# Patient Record
Sex: Male | Born: 1945 | Race: White | Hispanic: No | Marital: Married | Smoking: Former smoker
Health system: Southern US, Community
[De-identification: ages and names within clinical notes are randomized; demographics above are authoritative.]

## PROBLEM LIST (undated history)

## (undated) DIAGNOSIS — H348192 Central retinal vein occlusion, unspecified eye, stable: Secondary | ICD-10-CM

## (undated) DIAGNOSIS — Z9989 Dependence on other enabling machines and devices: Secondary | ICD-10-CM

## (undated) DIAGNOSIS — I10 Essential (primary) hypertension: Secondary | ICD-10-CM

## (undated) DIAGNOSIS — F419 Anxiety disorder, unspecified: Secondary | ICD-10-CM

## (undated) DIAGNOSIS — N402 Nodular prostate without lower urinary tract symptoms: Secondary | ICD-10-CM

## (undated) DIAGNOSIS — R972 Elevated prostate specific antigen [PSA]: Secondary | ICD-10-CM

## (undated) DIAGNOSIS — E119 Type 2 diabetes mellitus without complications: Secondary | ICD-10-CM

## (undated) DIAGNOSIS — G4733 Obstructive sleep apnea (adult) (pediatric): Secondary | ICD-10-CM

## (undated) DIAGNOSIS — L309 Dermatitis, unspecified: Secondary | ICD-10-CM

## (undated) DIAGNOSIS — C4491 Basal cell carcinoma of skin, unspecified: Secondary | ICD-10-CM

## (undated) DIAGNOSIS — E785 Hyperlipidemia, unspecified: Secondary | ICD-10-CM

## (undated) DIAGNOSIS — G4700 Insomnia, unspecified: Secondary | ICD-10-CM

## (undated) DIAGNOSIS — L299 Pruritus, unspecified: Secondary | ICD-10-CM

## (undated) HISTORY — DX: Nodular prostate without lower urinary tract symptoms: N40.2

## (undated) HISTORY — DX: Hyperlipidemia, unspecified: E78.5

## (undated) HISTORY — DX: Type 2 diabetes mellitus without complications: E11.9

## (undated) HISTORY — DX: Obstructive sleep apnea (adult) (pediatric): G47.33

## (undated) HISTORY — DX: Central retinal vein occlusion, unspecified eye, stable: H34.8192

## (undated) HISTORY — DX: Anxiety disorder, unspecified: F41.9

## (undated) HISTORY — PX: NO PAST SURGERIES: SHX2092

## (undated) HISTORY — DX: Elevated prostate specific antigen (PSA): R97.20

## (undated) HISTORY — DX: Essential (primary) hypertension: I10

## (undated) HISTORY — DX: Dependence on other enabling machines and devices: Z99.89

## (undated) HISTORY — PX: TOOTH EXTRACTION: SUR596

## (undated) HISTORY — DX: Basal cell carcinoma of skin, unspecified: C44.91

## (undated) HISTORY — DX: Insomnia, unspecified: G47.00

---

## 2007-09-25 ENCOUNTER — Ambulatory Visit: Payer: Self-pay | Admitting: Gastroenterology

## 2007-09-25 LAB — HM COLONOSCOPY

## 2011-05-01 ENCOUNTER — Ambulatory Visit: Payer: Self-pay | Admitting: Unknown Physician Specialty

## 2011-05-02 LAB — PATHOLOGY REPORT

## 2013-09-24 LAB — LIPID PANEL
CHOLESTEROL: 115 mg/dL (ref 0–200)
HDL: 29 mg/dL — AB (ref 35–70)
LDL Cholesterol: 65 mg/dL

## 2013-09-24 LAB — HEPATIC FUNCTION PANEL: Bilirubin, Total: 0.6 mg/dL

## 2013-09-24 LAB — CBC AND DIFFERENTIAL
HCT: 42 % (ref 41–53)
Hemoglobin: 14.2 g/dL (ref 13.5–17.5)
PLATELETS: 261 10*3/uL (ref 150–399)

## 2013-09-24 LAB — BASIC METABOLIC PANEL
CREATININE: 1.1 mg/dL (ref 0.6–1.3)
GLUCOSE: 238 mg/dL

## 2014-11-22 ENCOUNTER — Encounter: Payer: Self-pay | Admitting: *Deleted

## 2014-12-01 ENCOUNTER — Encounter: Payer: Self-pay | Admitting: *Deleted

## 2014-12-01 DIAGNOSIS — F411 Generalized anxiety disorder: Secondary | ICD-10-CM | POA: Insufficient documentation

## 2014-12-01 DIAGNOSIS — G4700 Insomnia, unspecified: Secondary | ICD-10-CM | POA: Insufficient documentation

## 2014-12-01 DIAGNOSIS — E785 Hyperlipidemia, unspecified: Secondary | ICD-10-CM | POA: Insufficient documentation

## 2014-12-01 DIAGNOSIS — G4733 Obstructive sleep apnea (adult) (pediatric): Secondary | ICD-10-CM | POA: Insufficient documentation

## 2014-12-01 DIAGNOSIS — Z23 Encounter for immunization: Secondary | ICD-10-CM | POA: Insufficient documentation

## 2014-12-01 DIAGNOSIS — N4289 Other specified disorders of prostate: Secondary | ICD-10-CM | POA: Insufficient documentation

## 2014-12-01 DIAGNOSIS — Z1211 Encounter for screening for malignant neoplasm of colon: Secondary | ICD-10-CM | POA: Insufficient documentation

## 2014-12-01 DIAGNOSIS — E119 Type 2 diabetes mellitus without complications: Secondary | ICD-10-CM | POA: Insufficient documentation

## 2014-12-01 DIAGNOSIS — L309 Dermatitis, unspecified: Secondary | ICD-10-CM | POA: Insufficient documentation

## 2014-12-01 DIAGNOSIS — I10 Essential (primary) hypertension: Secondary | ICD-10-CM | POA: Insufficient documentation

## 2014-12-01 DIAGNOSIS — L299 Pruritus, unspecified: Secondary | ICD-10-CM | POA: Insufficient documentation

## 2014-12-01 DIAGNOSIS — H348192 Central retinal vein occlusion, unspecified eye, stable: Secondary | ICD-10-CM | POA: Insufficient documentation

## 2014-12-01 DIAGNOSIS — Z79899 Other long term (current) drug therapy: Secondary | ICD-10-CM | POA: Insufficient documentation

## 2014-12-01 DIAGNOSIS — Z125 Encounter for screening for malignant neoplasm of prostate: Secondary | ICD-10-CM | POA: Insufficient documentation

## 2014-12-01 DIAGNOSIS — C4491 Basal cell carcinoma of skin, unspecified: Secondary | ICD-10-CM | POA: Insufficient documentation

## 2014-12-01 DIAGNOSIS — B86 Scabies: Secondary | ICD-10-CM | POA: Insufficient documentation

## 2014-12-08 ENCOUNTER — Telehealth: Payer: Self-pay | Admitting: Urology

## 2014-12-08 NOTE — Telephone Encounter (Signed)
The pt called with concerns after his biopsy on Friday, May 27th. He said he notice blood in his stool after the biopsy, but it subsided after 2 days. The pt is concern because the blood returned on yesterday. Its a bright red blood in his loose stool. He describes it as a gassy diarrhea with bright red blood. I spoke w/ Dr. Elnoria Howard and he recommended that the pt call his PCP to rule out, no infection. The pt called Dr. Rosanna Randy office and he informed the pt to go to the ER.  The pt called back and said that it's not a large amount and he thinks its possible hemorrhoids, because he's had them before. Dr. Elnoria Howard said the pt could just monitor his stool the blood and if it increase to go to the ER. If not just keep his appt to come in on next week.

## 2014-12-09 ENCOUNTER — Ambulatory Visit (INDEPENDENT_AMBULATORY_CARE_PROVIDER_SITE_OTHER): Payer: Commercial Managed Care - HMO | Admitting: Urology

## 2014-12-09 DIAGNOSIS — C61 Malignant neoplasm of prostate: Secondary | ICD-10-CM

## 2014-12-09 NOTE — Progress Notes (Signed)
Patient ID: Juan Mack, male   DOB: Feb 11, 1946, 69 y.o.   MRN: [id number]

## 2015-01-11 ENCOUNTER — Encounter: Payer: Self-pay | Admitting: Urology

## 2015-01-11 ENCOUNTER — Other Ambulatory Visit: Payer: Self-pay

## 2015-01-11 ENCOUNTER — Ambulatory Visit (INDEPENDENT_AMBULATORY_CARE_PROVIDER_SITE_OTHER): Payer: Commercial Managed Care - HMO | Admitting: Urology

## 2015-01-11 VITALS — BP 182/82 | HR 70 | Resp 18 | Ht 60.0 in | Wt 198.9 lb

## 2015-01-11 DIAGNOSIS — Z8546 Personal history of malignant neoplasm of prostate: Secondary | ICD-10-CM

## 2015-01-11 DIAGNOSIS — R972 Elevated prostate specific antigen [PSA]: Secondary | ICD-10-CM

## 2015-01-11 NOTE — Progress Notes (Addendum)
01/11/2015 10:09 AM   Juan Mack 1945/10/28 [id number]  Referring provider: No referring provider defined for this encounter.  Chief Complaint  Patient presents with  . Advice Only  . Follow-up    HPI: Dr. 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 Patient was given all options for treatment and elected to do watchful waiting for 6 months. He wants a PSA in 6 months. At age 69 I think this is a good option sintering the low grade of his prostate and the fact that none of the other biopsies showed any evidence cancer or high-grade PIM     PMH: Past Medical History  Diagnosis Date  . Hypertension   . Diabetes mellitus, type II   . Hyperlipidemia   . OSA on CPAP   . BCC (basal cell carcinoma)   . Central vein occlusion of retina   . Insomnia   . Anxiety disorder   . Prostate nodule   . Elevated PSA     Surgical History: No past surgical history on file.  Home Medications:    Medication List       This list is accurate as of: 01/11/15 10:09 AM.  Always use your most recent med list.               amLODipine 5 MG tablet  Commonly known as:  NORVASC  Take 5 mg by mouth daily.     aspirin 81 MG EC tablet  Take 81 mg by mouth daily. Swallow whole.     atorvastatin 20 MG tablet  Commonly known as:  LIPITOR  Take 20 mg by mouth daily.     benazepril 40 MG tablet  Commonly known as:  LOTENSIN  Take 40 mg by mouth daily.     latanoprost 0.005 % ophthalmic solution  Commonly known as:  XALATAN  Place 1 drop into both eyes at bedtime.     metFORMIN 1000 MG tablet  Commonly known as:  GLUCOPHAGE  Take 1,000 mg by mouth 2 (two) times daily with a meal.     pioglitazone 30 MG tablet  Commonly known as:  ACTOS  Take 30 mg by mouth daily.        Allergies: No Known Allergies  Family History: Family  History  Problem Relation Age of Onset  . Prostate cancer Brother     Father  . Colon cancer Brother   . Congestive Heart Failure Sister   . Lung cancer Sister   . Coronary artery disease Father   . Diabetes Mellitus II Brother     Social History:  reports that he quit smoking about 41 years ago. His smoking use included Cigarettes. He quit after 14 years of use. He does not have any smokeless tobacco history on file. He reports that he does not drink alcohol. His drug history is not on file.  ROS: Urological Symptom Review  Patient is experiencing the following symptoms: Erection problems (male only)   Review of Systems  Gastrointestinal (upper)  : Negative for upper GI symptoms  Gastrointestinal (lower) : Negative for lower GI symptoms  Constitutional : Negative for symptoms  Skin: Negative for skin symptoms  Eyes: Negative for eye symptoms  Ear/Nose/Throat : Negative for Ear/Nose/Throat symptoms  Hematologic/Lymphatic: Negative for Hematologic/Lymphatic symptoms  Cardiovascular : Negative for cardiovascular symptoms  Respiratory : Negative for respiratory symptoms  Endocrine: Negative for endocrine symptoms  Musculoskeletal: Negative for musculoskeletal symptoms  Neurological: Negative for neurological symptoms  Psychologic: Negative for psychiatric symptoms   Physical Exam: BP 182/82 mmHg  Pulse 70  Resp 18  Ht 5' (1.524 m)  Wt 198 lb 14.4 oz (90.22 kg)  BMI 38.84 kg/m2  Constitutional:  Alert and oriented, No acute distress. HEENT: [place] AT, moist mucus membranes.  Trachea midline, no masses. Cardiovascular: No clubbing, cyanosis, or edema. Respiratory: Normal respiratory effort, no increased work of breathing. GI: Abdomen is soft, nontender, nondistended, no abdominal masses GU: No CVA tenderness. Prostate approximately 40 g with a small nodule on the right Skin: No rashes, bruises or suspicious lesions. Lymph: No cervical or inguinal  adenopathy. Neurologic: Grossly intact, no focal deficits, moving all 4 extremities. Psychiatric: Normal mood and affect.  Laboratory Data: No results found for: WBC, HGB, HCT, MCV, PLT  No results found for: CREATININE  No results found for: PSA  No results found for: TESTOSTERONE  No results found for: HGBA1C  Urinalysis No results found for: COLORURINE, APPEARANCEUR, LABSPEC, PHURINE, GLUCOSEU, HGBUR, BILIRUBINUR, KETONESUR, PROTEINUR, UROBILINOGEN, NITRITE, LEUKOCYTESUR  Pertinent Imaging: None  Assessment & Plan:  Watchful waiting PSA in 5 months for a low-grade adenocarcinoma prostate  There are no diagnoses linked to this encounter.  Return in about 4 weeks (around 02/08/2015), or if symptoms worsen or fail to improve.  Collier Flowers, [facility] [address], [place], [place] [address] [phone]

## 2015-01-11 NOTE — Addendum Note (Signed)
Addended by: Rick Duff D on: 01/11/2015 10:18 AM   Modules accepted: Level of Service

## 2015-01-13 ENCOUNTER — Encounter: Payer: Self-pay | Admitting: Urology

## 2015-01-20 ENCOUNTER — Telehealth: Payer: Self-pay | Admitting: Family Medicine

## 2015-02-13 ENCOUNTER — Ambulatory Visit (INDEPENDENT_AMBULATORY_CARE_PROVIDER_SITE_OTHER): Payer: Commercial Managed Care - HMO | Admitting: Family Medicine

## 2015-02-13 ENCOUNTER — Encounter: Payer: Self-pay | Admitting: Family Medicine

## 2015-02-13 VITALS — BP 122/54 | HR 78 | Temp 98.3°F | Resp 16 | Ht 70.0 in | Wt 201.0 lb

## 2015-02-13 DIAGNOSIS — E119 Type 2 diabetes mellitus without complications: Secondary | ICD-10-CM | POA: Diagnosis not present

## 2015-02-13 DIAGNOSIS — E785 Hyperlipidemia, unspecified: Secondary | ICD-10-CM | POA: Diagnosis not present

## 2015-02-13 DIAGNOSIS — Z23 Encounter for immunization: Secondary | ICD-10-CM | POA: Diagnosis not present

## 2015-02-13 DIAGNOSIS — S80212A Abrasion, left knee, initial encounter: Secondary | ICD-10-CM | POA: Diagnosis not present

## 2015-02-13 DIAGNOSIS — I10 Essential (primary) hypertension: Secondary | ICD-10-CM

## 2015-02-13 NOTE — Progress Notes (Signed)
Patient ID: Juan Mack, male   DOB: [birth year], 69 y.o.   MRN: [id number]   Juan Mack  MRN: [id number] DOB: Mar 06, 1946  Subjective:  HPI   1. Essential hypertension Patient is a 69 year old who presents for follow up of his hypertension.  His last visit was on 10/18/14 and his blood pressure at that time was 138/62.  He is currenty taking Amlodipine and Benazepril.  He reports good compliance and tolerance of his medications.  He does not check his blood pressure outside the office.  2. Hyperlipidemia Patientt has not had his cholesterol checked in about 18 months and presents today for follow up.  He is currently taking Atorvastatin and reports good compliance and tolerance.  3. Type 2 diabetes mellitus without complication Patient is also here for his diabetes.  His last A1C was done on 04/25/14 and was 8.0.  He reports his glucose readings are running 180-220 in the morning and 90-130 in the evening.  He reports having no hypoglycemic symptoms.  He is checking his feet regularly and reports no sign of skin breakdown.     Patient Active Problem List   Diagnosis Date Noted  . Basal cell carcinoma of skin 12/01/2014  . 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 Special screening for malignant neoplasms, colon 12/01/2014  . Dermatitis, eczematoid 12/01/2014  . Polypharmacy 12/01/2014  . Essential (primary) hypertension 12/01/2014  . Anxiety, generalized 12/01/2014  . Flu vaccine need 12/01/2014  . HLD (hyperlipidemia) 12/01/2014  . Cannot sleep 12/01/2014  . Pruritic disorder 12/01/2014  . Obstructive apnea 12/01/2014  . Special screening for malignant neoplasm of prostate 12/01/2014  . Prostate lump 12/01/2014  . Scabies 12/01/2014  . Diabetes mellitus, type 2 12/01/2014    Past Medical History  Diagnosis Date  . Hypertension   . Diabetes mellitus, type II   . Hyperlipidemia   . OSA on CPAP   . BCC (basal cell carcinoma)   . Central vein occlusion of retina   .  Insomnia   . Anxiety disorder   . Prostate nodule   . Elevated PSA     History   Social History  . Marital Status: Married    Spouse Name: N/A  . Number of Children: N/A  . Years of Education: N/A   Occupational History  . Not on file.   Social History Main Topics  . Smoking status: Former Smoker -- 14 years    Types: Cigarettes    Quit date: 07/08/1973  . Smokeless tobacco: Not on file  . Alcohol Use: No  . Drug Use: Not on file  . Sexual Activity: Not on file   Other Topics Concern  . Not on file   Social History Narrative    Outpatient Prescriptions Prior to Visit  Medication Sig Dispense Refill  . amLODipine (NORVASC) 5 MG tablet Take 5 mg by mouth daily.    Marland Kitchen aspirin 81 MG EC tablet Take 81 mg by mouth daily. Swallow whole.    Marland Kitchen atorvastatin (LIPITOR) 20 MG tablet Take 20 mg by mouth daily.    . benazepril (LOTENSIN) 40 MG tablet Take 40 mg by mouth daily.    Marland Kitchen latanoprost (XALATAN) 0.005 % ophthalmic solution Place 1 drop into both eyes at bedtime.    . metFORMIN (GLUCOPHAGE) 1000 MG tablet Take 1,000 mg by mouth 2 (two) times daily with a meal.    . pioglitazone (ACTOS) 30 MG tablet Take 30 mg by mouth daily.  No facility-administered medications prior to visit.    No Known Allergies  Review of Systems  Constitutional: Negative.   Eyes: Negative.   Respiratory: Negative.   Cardiovascular: Negative.   Gastrointestinal: Negative.   Genitourinary: Negative for dysuria.  Musculoskeletal: Negative.   Neurological: Negative for dizziness and headaches.  Endo/Heme/Allergies: Negative for polydipsia.  Psychiatric/Behavioral: Negative.    Objective:  BP 122/54 mmHg  Pulse 78  Temp(Src) 98.3 F (36.8 C) (Oral)  Resp 16  Ht 5\' 10"  (1.778 m)  Wt 201 lb (91.173 kg)  BMI 28.84 kg/m2  Physical Exam  Constitutional: He is oriented to person, place, and time and well-developed, well-nourished, and in no distress.  HENT:  Head: Normocephalic and  atraumatic.  Right Ear: External ear normal.  Left Ear: External ear normal.  Nose: Nose normal.  Eyes: Conjunctivae are normal.  Neck: Neck supple.  Cardiovascular: Normal rate, regular rhythm and normal heart sounds.   Pulmonary/Chest: Effort normal and breath sounds normal.  Abdominal: Soft. Bowel sounds are normal.  Neurological: He is alert and oriented to person, place, and time. Gait normal.  Skin: Skin is warm and dry.  Psychiatric: Mood, memory, affect and judgment normal.    Assessment and Plan :  Type 2 diabetes Problems are treated aggressively to try to void and organ damage/disease  Hyperlipidemia  Hypertension  Sleep apnea [facility] 02/13/2015 11:04 AM

## 2015-02-15 LAB — COMPREHENSIVE METABOLIC PANEL
ALT: 13 IU/L (ref 0–44)
AST: 14 IU/L (ref 0–40)
Albumin/Globulin Ratio: 1.8 (ref 1.1–2.5)
Albumin: 4.4 g/dL (ref 3.6–4.8)
Alkaline Phosphatase: 55 IU/L (ref 39–117)
BUN / CREAT RATIO: 15 (ref 10–22)
BUN: 18 mg/dL (ref 8–27)
Bilirubin Total: 0.8 mg/dL (ref 0.0–1.2)
CO2: 22 mmol/L (ref 18–29)
Calcium: 10 mg/dL (ref 8.6–10.2)
Chloride: 101 mmol/L (ref 97–108)
Creatinine, Ser: 1.22 mg/dL (ref 0.76–1.27)
GFR calc Af Amer: 69 mL/min/{1.73_m2} (ref >59)
GFR calc non Af Amer: 60 mL/min/{1.73_m2} (ref >59)
Globulin, Total: 2.4 g/dL (ref 1.5–4.5)
Glucose: 218 mg/dL — ABNORMAL HIGH (ref 65–99)
Potassium: 5.4 mmol/L — ABNORMAL HIGH (ref 3.5–5.2)
SODIUM: 139 mmol/L (ref 134–144)
Total Protein: 6.8 g/dL (ref 6.0–8.5)

## 2015-02-15 LAB — CBC WITH DIFFERENTIAL/PLATELET
BASOS ABS: 0 10*3/uL (ref 0.0–0.2)
BASOS: 0 %
EOS (ABSOLUTE): 0.2 10*3/uL (ref 0.0–0.4)
Eos: 2 %
HEMOGLOBIN: 13.7 g/dL (ref 12.6–17.7)
Hematocrit: 41.7 % (ref 37.5–51.0)
Immature Grans (Abs): 0 10*3/uL (ref 0.0–0.1)
Immature Granulocytes: 0 %
LYMPHS: 30 %
Lymphocytes Absolute: 3 10*3/uL (ref 0.7–3.1)
MCH: 32.2 pg (ref 26.6–33.0)
MCHC: 32.9 g/dL (ref 31.5–35.7)
MCV: 98 fL — ABNORMAL HIGH (ref 79–97)
MONOCYTES: 7 %
Monocytes Absolute: 0.7 10*3/uL (ref 0.1–0.9)
NEUTROS ABS: 5.9 10*3/uL (ref 1.4–7.0)
NEUTROS PCT: 61 %
Platelets: 278 10*3/uL (ref 150–379)
RBC: 4.25 x10E6/uL (ref 4.14–5.80)
RDW: 14.1 % (ref 12.3–15.4)
WBC: 9.9 10*3/uL (ref 3.4–10.8)

## 2015-02-15 LAB — TSH: TSH: 1.96 u[IU]/mL (ref 0.450–4.500)

## 2015-02-15 LAB — LIPID PANEL WITH LDL/HDL RATIO
Cholesterol, Total: 123 mg/dL (ref 100–199)
HDL: 33 mg/dL — ABNORMAL LOW (ref >39)
LDL CALC: 70 mg/dL (ref 0–99)
LDl/HDL Ratio: 2.1 ratio units (ref 0.0–3.6)
Triglycerides: 102 mg/dL (ref 0–149)
VLDL CHOLESTEROL CAL: 20 mg/dL (ref 5–40)

## 2015-02-15 LAB — HEMOGLOBIN A1C
ESTIMATED AVERAGE GLUCOSE: 226 mg/dL
Hgb A1c MFr Bld: 9.5 % — ABNORMAL HIGH (ref 4.8–5.6)

## 2015-02-21 ENCOUNTER — Other Ambulatory Visit: Payer: Self-pay | Admitting: Family Medicine

## 2015-03-26 ENCOUNTER — Other Ambulatory Visit: Payer: Self-pay | Admitting: Family Medicine

## 2015-06-12 ENCOUNTER — Other Ambulatory Visit: Payer: Commercial Managed Care - HMO

## 2015-06-12 DIAGNOSIS — R972 Elevated prostate specific antigen [PSA]: Secondary | ICD-10-CM

## 2015-06-13 LAB — PSA: Prostate Specific Ag, Serum: 2.4 ng/mL (ref 0.0–4.0)

## 2015-06-16 ENCOUNTER — Ambulatory Visit (INDEPENDENT_AMBULATORY_CARE_PROVIDER_SITE_OTHER): Payer: Commercial Managed Care - HMO | Admitting: Urology

## 2015-06-16 ENCOUNTER — Encounter: Payer: Self-pay | Admitting: Urology

## 2015-06-16 VITALS — BP 157/67 | HR 74 | Ht 70.0 in | Wt 209.3 lb

## 2015-06-16 DIAGNOSIS — C61 Malignant neoplasm of prostate: Secondary | ICD-10-CM

## 2015-06-16 NOTE — Progress Notes (Signed)
06/16/2015 9:03 AM   Juan Mack [birth year] [id number]  Referring provider: Jerrol Banana., MD [address], [place] [address]  CC: Elevated PSA  HPI: 69 y.o. 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 Patient was given all options for treatment and elected to do watchful waiting for 6 months. He wants a PSA in 6 months. At age 54 I think this is a good option sintering the low grade of his prostate and the fact that none of the other biopsies showed any evidence cancer or high-grade PIM  Interval History: The patient returns for follow up. PSA in December 2016 is 2.4. A review of his chart shows that he 40 g prostate with a small nodule on the right side at his last visit. Review of his pathology report from May 2016 revealed 1 core of Gleason 3+3 equal 6 in the right lateral mid with a total volume of 3%. He has no complaints at this time. He has had no changes GU status since his last visit.   PMH: Past Medical History  Diagnosis Date  . Hypertension   . Diabetes mellitus, type II ([place])   . Hyperlipidemia   . OSA on CPAP   . BCC (basal cell carcinoma)   . Central vein occlusion of retina   . Insomnia   . Anxiety disorder   . Prostate nodule   . Elevated PSA     Surgical History: No past surgical history on file.  Home Medications:    Medication List       This list is accurate as of: 06/16/15  9:03 AM.  Always use your most recent med list.               amLODipine 5 MG tablet  Commonly known as:  NORVASC  TAKE 1 TABLET BY MOUTH EVERY DAY     aspirin 81 MG EC tablet  Take 81 mg by mouth daily. Swallow whole.     atorvastatin 20 MG tablet  Commonly known as:  LIPITOR  Take 20 mg by mouth daily.     benazepril 40 MG tablet  Commonly known as:  LOTENSIN  TAKE 1 TABLET BY MOUTH EVERY DAY     FLUZONE HIGH-DOSE 0.5 ML Susy    Generic drug:  Influenza Vac Split High-Dose  ADM 0.5ML IM UTD     latanoprost 0.005 % ophthalmic solution  Commonly known as:  XALATAN  Place 1 drop into both eyes at bedtime.     metFORMIN 1000 MG tablet  Commonly known as:  GLUCOPHAGE  Take 1,000 mg by mouth 2 (two) times daily with a meal.     pioglitazone 30 MG tablet  Commonly known as:  ACTOS  Take 30 mg by mouth daily.        Allergies: No Known Allergies  Family History: Family History  Problem Relation Age of Onset  . Prostate cancer Brother     Father  . Colon cancer Brother   . Congestive Heart Failure Sister   . Lung cancer Sister   . Coronary artery disease Father   . Diabetes Mellitus II Brother     Social History:  reports that he quit smoking about 41 years ago. His smoking use included Cigarettes. He quit after 14 years of use. He does not have any smokeless tobacco history on  file. He reports that he does not drink alcohol. His drug history is not on file.  ROS: UROLOGY Frequent Urination?: No Hard to postpone urination?: No Burning/pain with urination?: No Get up at night to urinate?: No Leakage of urine?: No Urine stream starts and stops?: No Trouble starting stream?: No Do you have to strain to urinate?: No Blood in urine?: No Urinary tract infection?: No Sexually transmitted disease?: No Injury to kidneys or bladder?: No Painful intercourse?: No Weak stream?: No Erection problems?: No Penile pain?: No  Gastrointestinal Nausea?: No Vomiting?: No Indigestion/heartburn?: No Diarrhea?: No Constipation?: No  Constitutional Fever: No Night sweats?: No Weight loss?: No Fatigue?: No  Skin Skin rash/lesions?: No Itching?: No  Eyes Blurred vision?: No Double vision?: No  Ears/Nose/Throat Sore throat?: No Sinus problems?: No  Hematologic/Lymphatic Swollen glands?: No Easy bruising?: No  Cardiovascular Leg swelling?: No Chest pain?: No  Respiratory Cough?:  No Shortness of breath?: No  Endocrine Excessive thirst?: No  Musculoskeletal Back pain?: No Joint pain?: No  Neurological Headaches?: No Dizziness?: No  Psychologic Depression?: No Anxiety?: No  Physical Exam: BP 157/67 mmHg  Pulse 74  Ht 5\' 10"  (1.778 m)  Wt 209 lb 4.8 oz (94.938 kg)  BMI 30.03 kg/m2  Constitutional:  Alert and oriented, No acute distress. HEENT: [place] AT, moist mucus membranes.  Trachea midline, no masses. Cardiovascular: No clubbing, cyanosis, or edema. Respiratory: Normal respiratory effort, no increased work of breathing. GI: Abdomen is soft, nontender, nondistended, no abdominal masses GU: No CVA tenderness.  normal phallus. Testicles descended equally bilaterally. No masses. DRE: 2+, induration noted at the right base which was documented on his previous exam. Skin: No rashes, bruises or suspicious lesions. Lymph: No cervical or inguinal adenopathy. Neurologic: Grossly intact, no focal deficits, moving all 4 extremities. Psychiatric: Normal mood and affect.  Laboratory Data: Lab Results  Component Value Date   WBC 9.9 02/14/2015   HCT 41.7 02/14/2015    Lab Results  Component Value Date   CREATININE 1.22 02/14/2015    Lab Results  Component Value Date   PSA 2.4 06/12/2015    No results found for: TESTOSTERONE  Lab Results  Component Value Date   HGBA1C 9.5* 02/14/2015    Urinalysis No results found for: COLORURINE, APPEARANCEUR, LABSPEC, PHURINE, GLUCOSEU, HGBUR, BILIRUBINUR, KETONESUR, PROTEINUR, UROBILINOGEN, NITRITE, LEUKOCYTESUR     Assessment & Plan:    The patient that since his last visit his PSA has trended down from 4.0 to 2.4. Given his very low risk prostate cancer his age the patient will continue watchful waiting.  1. Very low risk prostate cancer -f/u in 6 months with PSA prior  Return in about 6 months (around 12/15/2015) for with PSA one week prior.  Nickie Retort, MD  [facility] [address], [place], [place] [address] [phone]

## 2015-06-22 ENCOUNTER — Ambulatory Visit (INDEPENDENT_AMBULATORY_CARE_PROVIDER_SITE_OTHER): Payer: Commercial Managed Care - HMO | Admitting: Family Medicine

## 2015-06-22 ENCOUNTER — Encounter: Payer: Self-pay | Admitting: Family Medicine

## 2015-06-22 VITALS — BP 148/60 | HR 84 | Temp 98.7°F | Resp 16 | Wt 210.0 lb

## 2015-06-22 DIAGNOSIS — E119 Type 2 diabetes mellitus without complications: Secondary | ICD-10-CM | POA: Diagnosis not present

## 2015-06-22 DIAGNOSIS — I10 Essential (primary) hypertension: Secondary | ICD-10-CM | POA: Diagnosis not present

## 2015-06-22 LAB — POCT GLYCOSYLATED HEMOGLOBIN (HGB A1C): Hemoglobin A1C: 8.6

## 2015-06-22 NOTE — Progress Notes (Signed)
Patient ID: Juan Mack, male   DOB: 01/30/1946, 69 y.o.   MRN: [id number]    Subjective:  HPI  Diabetes Mellitus Type II, Follow-up:   Lab Results  Component Value Date   HGBA1C 9.5* 02/14/2015    Last seen for diabetes 4 months ago.  Management since then includes none. He reports good compliance with treatment. He is not having side effects.  Home blood sugar records: 150-180 fasting  Episodes of hypoglycemia? no   Current Insulin Regimen: n/a Most Recent Eye Exam: 2 months ago Current exercise: walking, daily  Pertinent Labs:    Component Value Date/Time   CHOL 123 02/14/2015 0814   TRIG 102 02/14/2015 0814   HDL 33* 02/14/2015 0814   LDLCALC 70 02/14/2015 0814   CREATININE 1.22 02/14/2015 0814    Wt Readings from Last 3 Encounters:  06/22/15 210 lb (95.255 kg)  06/16/15 209 lb 4.8 oz (94.938 kg)  02/13/15 201 lb (91.173 kg)    ------------------------------------------------------------------------    Hypertension, follow-up:  BP Readings from Last 3 Encounters:  06/22/15 148/60  06/16/15 157/67  02/13/15 122/54    He was last seen for hypertension 4 months ago.  BP at that visit was 157/67. Management since that visit includes none. He reports good compliance with treatment. He is not having side effects.  He is exercising.   Outside blood pressures are not being checked. He is experiencing none.  Patient denies chest pain, chest pressure/discomfort, claudication, dyspnea, exertional chest pressure/discomfort, fatigue, irregular heart beat and palpitations.     Wt Readings from Last 3 Encounters:  06/22/15 210 lb (95.255 kg)  06/16/15 209 lb 4.8 oz (94.938 kg)  02/13/15 201 lb (91.173 kg)    ------------------------------------------------------------------------     Prior to Admission medications   Medication Sig Start Date End Date Taking? Authorizing Provider  amLODipine (NORVASC) 5 MG tablet TAKE 1 TABLET BY MOUTH EVERY DAY  03/27/15  Yes Jerrol Banana., MD  aspirin 81 MG EC tablet Take 81 mg by mouth daily. Swallow whole.   Yes Historical Provider, MD  atorvastatin (LIPITOR) 20 MG tablet Take 20 mg by mouth daily.   Yes Historical Provider, MD  benazepril (LOTENSIN) 40 MG tablet TAKE 1 TABLET BY MOUTH EVERY DAY 03/27/15  Yes Richard Maceo Pro., MD  latanoprost (XALATAN) 0.005 % ophthalmic solution Place 1 drop into both eyes at bedtime.   Yes Historical Provider, MD  metFORMIN (GLUCOPHAGE) 1000 MG tablet Take 1,000 mg by mouth 2 (two) times daily with a meal.   Yes Historical Provider, MD  pioglitazone (ACTOS) 30 MG tablet Take 30 mg by mouth daily.   Yes Historical Provider, MD    Patient Active Problem List   Diagnosis Date Noted  . Basal cell carcinoma of skin 12/01/2014  . 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 Special screening for malignant neoplasms, colon 12/01/2014  . Dermatitis, eczematoid 12/01/2014  . Polypharmacy 12/01/2014  . Essential (primary) hypertension 12/01/2014  . Anxiety, generalized 12/01/2014  . Flu vaccine need 12/01/2014  . HLD (hyperlipidemia) 12/01/2014  . Cannot sleep 12/01/2014  . Pruritic disorder 12/01/2014  . Obstructive apnea 12/01/2014  . Special screening for malignant neoplasm of prostate 12/01/2014  . Prostate lump 12/01/2014  . Scabies 12/01/2014  . Diabetes mellitus, type 2 ([place]) 12/01/2014    Past Medical History  Diagnosis Date  . Hypertension   . Diabetes mellitus, type II ([place])   . Hyperlipidemia   . OSA on CPAP   .  BCC (basal cell carcinoma)   . Central vein occlusion of retina   . Insomnia   . Anxiety disorder   . Prostate nodule   . Elevated PSA     Social History   Social History  . Marital Status: Married    Spouse Name: N/A  . Number of Children: N/A  . Years of Education: N/A   Occupational History  . Not on file.   Social History Main Topics  . Smoking status: Former Smoker -- 14 years    Types: Cigarettes     Quit date: 07/08/1973  . Smokeless tobacco: Not on file  . Alcohol Use: No  . Drug Use: No  . Sexual Activity: Not on file   Other Topics Concern  . Not on file   Social History Narrative    No Known Allergies  Review of Systems  Constitutional: Negative.   HENT: Negative.   Eyes: Negative.   Respiratory: Negative.   Cardiovascular: Negative.   Gastrointestinal: Negative.   Genitourinary: Negative.   Musculoskeletal: Negative.   Skin: Negative.   Neurological: Negative.   Endo/Heme/Allergies: Negative.   Psychiatric/Behavioral: Negative.     Immunization History  Administered Date(s) Administered  . Pneumococcal Conjugate-13 12/14/2013  . Pneumococcal Polysaccharide-23 02/28/2011  . Td 07/27/2003, 02/13/2015   Objective:  BP 148/60 mmHg  Pulse 84  Temp(Src) 98.7 F (37.1 C) (Oral)  Resp 16  Wt 210 lb (95.255 kg)  Physical Exam  Constitutional: He is oriented to person, place, and time and well-developed, well-nourished, and in no distress.  Eyes: Conjunctivae and EOM are normal. Pupils are equal, round, and reactive to light.  Neck: Normal range of motion. Neck supple.  Cardiovascular: Normal rate, regular rhythm, normal heart sounds and intact distal pulses.   Pulmonary/Chest: Effort normal and breath sounds normal.  Abdominal: Soft. Bowel sounds are normal.  Musculoskeletal: Normal range of motion.  Neurological: He is alert and oriented to person, place, and time. He has normal reflexes. Gait normal. GCS score is 15.  Skin: Skin is warm and dry.  Psychiatric: Mood, memory, affect and judgment normal.    Lab Results  Component Value Date   WBC 9.9 02/14/2015   HCT 41.7 02/14/2015   GLUCOSE 218* 02/14/2015   CHOL 123 02/14/2015   TRIG 102 02/14/2015   HDL 33* 02/14/2015   LDLCALC 70 02/14/2015   TSH 1.960 02/14/2015   PSA 2.4 06/12/2015   HGBA1C 9.5* 02/14/2015    CMP     Component Value Date/Time   NA 139 02/14/2015 0814   K 5.4* 02/14/2015  0814   CL 101 02/14/2015 0814   CO2 22 02/14/2015 0814   GLUCOSE 218* 02/14/2015 0814   BUN 18 02/14/2015 0814   CREATININE 1.22 02/14/2015 0814   CALCIUM 10.0 02/14/2015 0814   PROT 6.8 02/14/2015 0814   ALBUMIN 4.4 02/14/2015 0814   AST 14 02/14/2015 0814   ALT 13 02/14/2015 0814   ALKPHOS 55 02/14/2015 0814   BILITOT 0.8 02/14/2015 0814   GFRNONAA 60 02/14/2015 0814   GFRAA 69 02/14/2015 0814    Assessment and Plan :  1. Essential (primary) hypertension- Elevated. May increase Amlodpine depending on BP reading next OV.    2. Type 2 diabetes mellitus without complication, without long-term current use of insulin ([place]) Improving control presently. - POCT HgB A1C-8.6 today. Better. Continue diet and exercise.  3.HLD  Patient was seen and examined by Dr. Miguel Aschoff, and noted scribed by Webb Laws, [place]  Miguel Aschoff MD [facility] 06/22/2015 10:22 AM

## 2015-08-21 ENCOUNTER — Other Ambulatory Visit: Payer: Self-pay | Admitting: Family Medicine

## 2015-09-10 ENCOUNTER — Other Ambulatory Visit: Payer: Self-pay | Admitting: Family Medicine

## 2015-10-24 ENCOUNTER — Ambulatory Visit (INDEPENDENT_AMBULATORY_CARE_PROVIDER_SITE_OTHER): Payer: PPO | Admitting: Family Medicine

## 2015-10-24 VITALS — BP 150/60 | HR 64 | Temp 98.0°F | Resp 14 | Ht 70.0 in | Wt 208.0 lb

## 2015-10-24 DIAGNOSIS — E119 Type 2 diabetes mellitus without complications: Secondary | ICD-10-CM | POA: Diagnosis not present

## 2015-10-24 DIAGNOSIS — Z0000 Encounter for general adult medical examination without abnormal findings: Secondary | ICD-10-CM

## 2015-10-24 MED ORDER — GLUCOSE BLOOD VI STRP: ORAL_STRIP | Status: DC

## 2015-10-24 NOTE — Progress Notes (Signed)
Patient ID: Juan Mack, male   DOB: [birth year], 70 y.o.   MRN: [id number] Patient: Juan Mack, Male    DOB: [birth year], 70 y.o.   MRN: [id number] Visit Date: 10/24/2015  Today's Provider: Wilhemena Durie, MD   Chief Complaint  Patient presents with  . Annual Exam   Subjective:   Juan LOCKERMAN is a 70 y.o. male who presents today for his Subsequent Annual Wellness Visit. He feels well. He reports exercising daily. He reports he is sleeping well.  Immunization History  Administered Date(s) Administered  . Pneumococcal Conjugate-13 12/14/2013  . Pneumococcal Polysaccharide-23 02/28/2011  . Td 07/27/2003, 02/13/2015   05/01/2011 Colonoscopy-Tubular Adenoma, repeat in 04/2016   Review of Systems  Constitutional: Negative.   HENT: Negative.   Eyes: Negative.   Respiratory: Negative.   Cardiovascular: Negative.   Gastrointestinal: Negative.   Endocrine: Negative.   Genitourinary: Negative.   Musculoskeletal: Negative.   Skin: Negative.   Allergic/Immunologic: Negative.   Neurological: Negative.   Hematological: Negative.   Psychiatric/Behavioral: Negative.     Patient Active Problem List   Diagnosis Date Noted  . Basal cell carcinoma of skin 12/01/2014  . 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 Special screening for malignant neoplasms, colon 12/01/2014  . Dermatitis, eczematoid 12/01/2014  . Polypharmacy 12/01/2014  . Essential (primary) hypertension 12/01/2014  . Anxiety, generalized 12/01/2014  . Flu vaccine need 12/01/2014  . HLD (hyperlipidemia) 12/01/2014  . Cannot sleep 12/01/2014  . Pruritic disorder 12/01/2014  . Obstructive apnea 12/01/2014  . Special screening for malignant neoplasm of prostate 12/01/2014  . Prostate lump 12/01/2014  . Scabies 12/01/2014  . Diabetes mellitus, type 2 ([place]) 12/01/2014    Social History   Social History  . Marital Status: Married    Spouse Name: N/A  . Number of Children: N/A  . Years of Education: N/A    Occupational History  . Not on file.   Social History Main Topics  . Smoking status: Former Smoker -- 14 years    Types: Cigarettes    Quit date: 07/08/1973  . Smokeless tobacco: Not on file  . Alcohol Use: No  . Drug Use: No  . Sexual Activity: Not on file   Other Topics Concern  . Not on file   Social History Narrative    Past Surgical History  Procedure Laterality Date  . No past surgeries      His family history includes Colon cancer in his brother; Congestive Heart Failure in his mother; Coronary artery disease in his father; Diabetes in his brother and mother; Heart disease in his father; Lung cancer in his sister; Pancreatic cancer in his sister; Prostate cancer in his brother.    Outpatient Prescriptions Prior to Visit  Medication Sig Dispense Refill  . amLODipine (NORVASC) 5 MG tablet TAKE 1 TABLET BY MOUTH EVERY DAY 30 tablet 12  . aspirin 81 MG EC tablet Take 81 mg by mouth daily. Swallow whole.    Marland Kitchen atorvastatin (LIPITOR) 20 MG tablet TAKE 1 TABLET BY MOUTH DAILY. 90 tablet 0  . benazepril (LOTENSIN) 40 MG tablet TAKE 1 TABLET BY MOUTH EVERY DAY 30 tablet 12  . latanoprost (XALATAN) 0.005 % ophthalmic solution Place 1 drop into both eyes at bedtime.    . metFORMIN (GLUCOPHAGE) 1000 MG tablet Take 1,000 mg by mouth 2 (two) times daily with a meal.    . pioglitazone (ACTOS) 30 MG tablet TAKE 1 TABLET BY MOUTH EVERY DAY 30 tablet 12  No facility-administered medications prior to visit.    No Known Allergies  Patient Care Team: Jerrol Banana., MD as PCP - General (Family Medicine)  Objective:   Vitals:  Filed Vitals:   10/24/15 0907  BP: 150/60  Pulse: 64  Temp: 98 F (36.7 C)  TempSrc: Oral  Resp: 14  Height: 5\' 10"  (1.778 m)  Weight: 208 lb (94.348 kg)    Physical Exam  Constitutional: He is oriented to person, place, and time. He appears well-developed and well-nourished.  HENT:  Head: Normocephalic and atraumatic.  Right Ear:  External ear normal.  Left Ear: External ear normal.  Nose: Nose normal.  Mouth/Throat: Oropharynx is clear and moist.  Eyes: Conjunctivae and EOM are normal. Pupils are equal, round, and reactive to light.  Neck: Normal range of motion. Neck supple.  Cardiovascular: Normal rate, regular rhythm, normal heart sounds and intact distal pulses.   Pulmonary/Chest: Effort normal and breath sounds normal.  Abdominal: Soft. Bowel sounds are normal.  Musculoskeletal: Normal range of motion.  Neurological: He is alert and oriented to person, place, and time.  Skin: Skin is warm and dry.  Psychiatric: He has a normal mood and affect. His behavior is normal. Judgment and thought content normal.    Activities of Daily Living In your present state of health, do you have any difficulty performing the following activities: 10/24/2015 06/22/2015  Hearing? N N  Vision? N N  Difficulty concentrating or making decisions? N N  Walking or climbing stairs? N N  Dressing or bathing? N N  Doing errands, shopping? N N    Fall Risk Assessment Fall Risk  10/24/2015 06/22/2015  Falls in the past year? No No     Depression Screen PHQ 2/9 Scores 10/24/2015 06/22/2015  PHQ - 2 Score 0 0    Cognitive Testing - 6-CIT    Year: 0 4 points  Month: 0 3 points  Memorize "Pia Mau, [address] [place]"  Time (within 1 hour:) 0 3 points  Count backwards from 20: 0 2 4 points  Name months of year: 0 2 4 points  Repeat Address: 0 2 4 6 8 10  points   Total Score: 2/28  Interpretation : Normal (0-7) Abnormal (8-28)    Assessment & Plan:     Annual Wellness Visit  Reviewed patient's Family Medical History Reviewed and updated list of patient's medical providers Assessment of cognitive impairment was done Assessed patient's functional ability Established a written schedule for health screening [name] Completed and Reviewed  Exercise Activities and Dietary  recommendations Goals    None      Immunization History  Administered Date(s) Administered  . Pneumococcal Conjugate-13 12/14/2013  . Pneumococcal Polysaccharide-23 02/28/2011  . Td 07/27/2003, 02/13/2015    Health Maintenance  Topic Date Due  . Hepatitis C Screening  11-20-1945  . OPHTHALMOLOGY EXAM  04/22/1955  . ZOSTAVAX  04/21/2005  . INFLUENZA VACCINE  06/21/2016 (Originally 02/06/2016)  . HEMOGLOBIN A1C  12/21/2015  . FOOT EXAM  02/13/2016  . COLONOSCOPY  09/24/2017  . TETANUS/TDAP  02/12/2025  . PNA vac Low Risk Adult  Completed      Discussed health benefits of physical activity, and encouraged him to engage in regular exercise appropriate for his age and condition.   I have done the exam and reviewed the above chart and it is accurate to the best of my knowledge.  Miguel Aschoff MD [facility] 10/24/2015  9:09 AM  ------------------------------------------------------------------------------------------------------------

## 2015-10-31 DIAGNOSIS — E119 Type 2 diabetes mellitus without complications: Secondary | ICD-10-CM | POA: Diagnosis not present

## 2015-10-31 LAB — HM DIABETES EYE EXAM

## 2015-11-11 ENCOUNTER — Other Ambulatory Visit: Payer: Self-pay | Admitting: Family Medicine

## 2015-11-22 ENCOUNTER — Other Ambulatory Visit: Payer: Self-pay | Admitting: Family Medicine

## 2015-12-15 ENCOUNTER — Other Ambulatory Visit: Payer: PPO

## 2015-12-15 DIAGNOSIS — C61 Malignant neoplasm of prostate: Secondary | ICD-10-CM | POA: Diagnosis not present

## 2015-12-16 LAB — PSA: Prostate Specific Ag, Serum: 2.3 ng/mL (ref 0.0–4.0)

## 2015-12-18 ENCOUNTER — Other Ambulatory Visit: Payer: Self-pay | Admitting: Family Medicine

## 2015-12-19 ENCOUNTER — Other Ambulatory Visit: Payer: Self-pay

## 2015-12-19 MED ORDER — ATORVASTATIN CALCIUM 20 MG PO TABS: 20.0000 mg | ORAL_TABLET | Freq: Every day | ORAL | Status: DC

## 2015-12-21 ENCOUNTER — Telehealth: Payer: Self-pay

## 2015-12-21 NOTE — Telephone Encounter (Signed)
-----   Message from Nickie Retort, MD sent at 12/21/2015  8:37 AM EDT ----- Please let patient know PSA is stable at 2.3

## 2015-12-21 NOTE — Telephone Encounter (Signed)
Spoke with pt wife in reference to PSA results. Wife voiced understanding.  

## 2015-12-22 ENCOUNTER — Encounter: Payer: Self-pay | Admitting: Urology

## 2015-12-22 ENCOUNTER — Ambulatory Visit (INDEPENDENT_AMBULATORY_CARE_PROVIDER_SITE_OTHER): Payer: PPO | Admitting: Urology

## 2015-12-22 VITALS — BP 178/75 | HR 71 | Ht 70.0 in | Wt 207.0 lb

## 2015-12-22 DIAGNOSIS — C61 Malignant neoplasm of prostate: Secondary | ICD-10-CM

## 2015-12-22 MED ORDER — DIAZEPAM 10 MG PO TABS: 10.0000 mg | ORAL_TABLET | Freq: Four times a day (QID) | ORAL | Status: DC | PRN

## 2015-12-22 NOTE — Progress Notes (Signed)
9:12 AM  12/22/2015   Juan Mack 03/19/1946 [id number]  Referring provider: Jerrol Banana., MD [address] [place], [place] [address]  CC: Elevated PSA  HPI: 70 y.o. 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  40 g prostate with small right sided nodule.    Most recent PSA 06/2015 2.4, stable this week at 2.3   He has had no changes GU status since his last visit.   PMH: Past Medical History  Diagnosis Date  . Hypertension   . Diabetes mellitus, type II ([place])   . Hyperlipidemia   . OSA on CPAP   . BCC (basal cell carcinoma)   . Central vein occlusion of retina   . Insomnia   . Anxiety disorder   . Prostate nodule   . Elevated PSA     Surgical History: Past Surgical History  Procedure Laterality Date  . No past surgeries      Home Medications:    Medication List       This list is accurate as of: 12/22/15  9:12 AM.  Always use your most recent med list.               amLODipine 5 MG tablet  Commonly known as:  NORVASC  TAKE 1 TABLET BY MOUTH EVERY DAY     aspirin 81 MG EC tablet  Take 81 mg by mouth daily. Swallow whole.     atorvastatin 20 MG tablet  Commonly known as:  LIPITOR  Take 1 tablet (20 mg total) by mouth daily.     benazepril 40 MG tablet  Commonly known as:  LOTENSIN  TAKE 1 TABLET BY MOUTH EVERY DAY     diazepam 10 MG tablet  Commonly known as:  VALIUM  Take 1 tablet (10 mg total) by mouth every 6 (six) hours as needed for anxiety.     glucose blood test strip  Use as instructed     latanoprost 0.005 % ophthalmic solution  Commonly known as:  XALATAN  Place 1 drop into both eyes at bedtime.     metFORMIN 1000 MG tablet  Commonly known as:  GLUCOPHAGE  TAKE 1 TABLET BY MOUTH TWICE DAILY     pioglitazone 30 MG tablet  Commonly known as:  ACTOS  TAKE 1 TABLET BY  MOUTH EVERY DAY        Allergies: No Known Allergies  Family History: Family History  Problem Relation Age of Onset  . Prostate cancer Brother     Father  . Diabetes Brother   . Colon cancer Brother   . Pancreatic cancer Sister   . Lung cancer Sister   . Coronary artery disease Father   . Heart disease Father   . Congestive Heart Failure Mother   . Diabetes Mother     Social History:  reports that he quit smoking about 42 years ago. His smoking use included Cigarettes. He quit after 14 years of use. He does not have any smokeless tobacco history on file. He reports that he does not drink alcohol or use illicit drugs.  ROS: UROLOGY Frequent Urination?: No Hard to postpone urination?: No Burning/pain with urination?: No Get up at night to urinate?: No Leakage of urine?: No Urine stream starts and stops?: No Trouble starting stream?: No Do you have  to strain to urinate?: No Blood in urine?: No Urinary tract infection?: No Sexually transmitted disease?: No Injury to kidneys or bladder?: No Painful intercourse?: No Weak stream?: No Erection problems?: No Penile pain?: No  Gastrointestinal Nausea?: No Vomiting?: No Indigestion/heartburn?: No Diarrhea?: No Constipation?: No  Constitutional Fever: No Night sweats?: No Weight loss?: No Fatigue?: No  Skin Skin rash/lesions?: No Itching?: No  Eyes Blurred vision?: No Double vision?: No  Ears/Nose/Throat Sore throat?: No Sinus problems?: No  Hematologic/Lymphatic Swollen glands?: No Easy bruising?: No  Cardiovascular Leg swelling?: No Chest pain?: No  Respiratory Cough?: No Shortness of breath?: No  Endocrine Excessive thirst?: No  Musculoskeletal Back pain?: No Joint pain?: No  Neurological Headaches?: No Dizziness?: No  Psychologic Depression?: No Anxiety?: No  Physical Exam: BP 178/75 mmHg  Pulse 71  Ht 5\' 10"  (1.778 m)  Wt 207 lb (93.895 kg)  BMI 29.70 kg/m2  Constitutional:   Alert and oriented, No acute distress.  Accompanied by wife today.   HEENT: [place] AT, moist mucus membranes.  Trachea midline, no masses. Cardiovascular: No clubbing, cyanosis, or edema. Respiratory: Normal respiratory effort, no increased work of breathing. GI: Abdomen is soft, nontender, nondistended, no abdominal masses GU: No CVA tenderness.  normal phallus. Testicles descended equally bilaterally. No masses.  DRE: 2+, induration noted at the right base which was documented on his previous exam. Skin: No rashes, bruises or suspicious lesions.. Neurologic: Grossly intact, no focal deficits, moving all 4 extremities. Psychiatric: Normal mood and affect.  Laboratory Data: Lab Results  Component Value Date   WBC 9.9 02/14/2015   HGB 14.2 09/24/2013   HCT 41.7 02/14/2015   MCV 98* 02/14/2015   PLT 278 02/14/2015    Lab Results  Component Value Date   CREATININE 1.22 02/14/2015    Component     Latest Ref Rng 06/12/2015 12/15/2015  PSA     0.0 - 4.0 ng/mL 2.4 2.3    Lab Results  Component Value Date   HGBA1C 8.6 06/22/2015      Assessment & Plan:    1. Very low risk prostate cancer -Reviewed active surveillance protocol which often involve either repeat biopsy at 1 year versus endorectal MRI. Given the presence of a prostate nodule, may be prudent to ensure that the biopsied specimen is a true reflection of his disease burden -Risk-benefit prostate biopsy versus prostate MRI discussed. He understands that if he proceeds with MRI, he may ultimately need to undergo repeat biopsy if a suspicious lesion is identified -Patient would like to pursue prostate biopsy, Valium given today due to his history of claustrophobia -will call with MRI results if unremarkable vs. Arrange for appropriate follow up if further action needed -f/u in 6 months with PSA prior  Return in about 6 months (around 06/22/2016) for PSA/ DRE, will call with MRI results.  Hollice Espy, MD  [facility] [address], [place], [place] [address] [phone]  I 25 spent min with this patient of which greater than 50% was spent in counseling and coordination of care with the patient.

## 2015-12-25 ENCOUNTER — Ambulatory Visit (INDEPENDENT_AMBULATORY_CARE_PROVIDER_SITE_OTHER): Payer: PPO | Admitting: Family Medicine

## 2015-12-25 VITALS — BP 138/64 | HR 68 | Temp 98.0°F | Resp 14 | Wt 210.0 lb

## 2015-12-25 DIAGNOSIS — Z1211 Encounter for screening for malignant neoplasm of colon: Secondary | ICD-10-CM | POA: Diagnosis not present

## 2015-12-25 DIAGNOSIS — I10 Essential (primary) hypertension: Secondary | ICD-10-CM | POA: Diagnosis not present

## 2015-12-25 DIAGNOSIS — E785 Hyperlipidemia, unspecified: Secondary | ICD-10-CM | POA: Diagnosis not present

## 2015-12-25 DIAGNOSIS — E119 Type 2 diabetes mellitus without complications: Secondary | ICD-10-CM

## 2015-12-25 NOTE — Progress Notes (Signed)
Juan Mack  MRN: [id number] DOB: 1946-04-06  Subjective:  HPI   The patient is a 70 year old male who presents for follow up of chronic issues.  His last visit was  On 10/24/15 and his last A1C was in December and it was 8.6.  He checks his glucose at home and reports readings ranging 100-170.  He reports no hypoglycemic events or symptoms.  He does not check his blood pressure outside of the office but states that when he saw urology last week his pressure was systolic 123456 something.  He did have his PSA at her office at that time and it was 2.3. It is also time for cholesterol to be checked. Recheck BP was 138/64. Lab Results  Component Value Date   CHOL 123 02/14/2015   CHOL 115 09/24/2013   Lab Results  Component Value Date   HDL 33* 02/14/2015   HDL 29* 09/24/2013   Lab Results  Component Value Date   LDLCALC 70 02/14/2015   LDLCALC 65 09/24/2013   Lab Results  Component Value Date   TRIG 102 02/14/2015   No results found for: CHOLHDL No results found for: LDLDIRECT  Lab Results  Component Value Date   CREATININE 1.22 02/14/2015   BUN 18 02/14/2015   NA 139 02/14/2015   K 5.4* 02/14/2015   CL 101 02/14/2015   CO2 22 02/14/2015   Lab Results  Component Value Date   WBC 9.9 02/14/2015   HGB 14.2 09/24/2013   HCT 41.7 02/14/2015   MCV 98* 02/14/2015   PLT 278 02/14/2015    Patient Active Problem List   Diagnosis Date Noted  . Basal cell carcinoma of skin 12/01/2014  . 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 Special screening for malignant neoplasms, colon 12/01/2014  . Dermatitis, eczematoid 12/01/2014  . Polypharmacy 12/01/2014  . Essential (primary) hypertension 12/01/2014  . Anxiety, generalized 12/01/2014  . Flu vaccine need 12/01/2014  . HLD (hyperlipidemia) 12/01/2014  . Cannot sleep 12/01/2014  . Pruritic disorder 12/01/2014  . Obstructive apnea 12/01/2014  . Special screening for malignant neoplasm of prostate 12/01/2014  .  Prostate lump 12/01/2014  . Scabies 12/01/2014  . Diabetes mellitus, type 2 ([place]) 12/01/2014    Past Medical History  Diagnosis Date  . Hypertension   . Diabetes mellitus, type II ([place])   . Hyperlipidemia   . OSA on CPAP   . BCC (basal cell carcinoma)   . Central vein occlusion of retina   . Insomnia   . Anxiety disorder   . Prostate nodule   . Elevated PSA     Social History   Social History  . Marital Status: Married    Spouse Name: N/A  . Number of Children: N/A  . Years of Education: N/A   Occupational History  . Not on file.   Social History Main Topics  . Smoking status: Former Smoker -- 14 years    Types: Cigarettes    Quit date: 07/08/1973  . Smokeless tobacco: Not on file  . Alcohol Use: No  . Drug Use: No  . Sexual Activity: Not on file   Other Topics Concern  . Not on file   Social History Narrative    Outpatient Prescriptions Prior to Visit  Medication Sig Dispense Refill  . amLODipine (NORVASC) 5 MG tablet TAKE 1 TABLET BY MOUTH EVERY DAY 30 tablet 12  . aspirin 81 MG EC tablet Take 81 mg by mouth daily. Swallow whole.    Marland Kitchen  atorvastatin (LIPITOR) 20 MG tablet Take 1 tablet (20 mg total) by mouth daily. 90 tablet 3  . benazepril (LOTENSIN) 40 MG tablet TAKE 1 TABLET BY MOUTH EVERY DAY 30 tablet 12  . diazepam (VALIUM) 10 MG tablet Take 1 tablet (10 mg total) by mouth every 6 (six) hours as needed for anxiety. 1 tablet 0  . glucose blood test strip Use as instructed 100 each 12  . latanoprost (XALATAN) 0.005 % ophthalmic solution Place 1 drop into both eyes at bedtime.    . metFORMIN (GLUCOPHAGE) 1000 MG tablet TAKE 1 TABLET BY MOUTH TWICE DAILY 60 tablet 12  . pioglitazone (ACTOS) 30 MG tablet TAKE 1 TABLET BY MOUTH EVERY DAY 30 tablet 12   No facility-administered medications prior to visit.    No Known Allergies  Review of Systems  Constitutional: Negative for fever and weight loss.  Eyes: Negative.   Respiratory: Negative for cough,  shortness of breath and wheezing.   Cardiovascular: Negative for chest pain, palpitations, orthopnea, claudication, leg swelling and PND.  Gastrointestinal: Negative.   Genitourinary: Negative for frequency.  Neurological: Negative for dizziness, weakness and headaches.  Endo/Heme/Allergies: Negative for polydipsia.  Psychiatric/Behavioral: Negative.    Objective:  BP 162/70 mmHg  Pulse 68  Temp(Src) 98 F (36.7 C) (Oral)  Resp 14  Wt 210 lb (95.255 kg)  Physical Exam  Constitutional: He is oriented to person, place, and time and well-developed, well-nourished, and in no distress.  HENT:  Head: Normocephalic and atraumatic.  Right Ear: External ear normal.  Left Ear: External ear normal.  Nose: Nose normal.  Eyes: Conjunctivae are normal.  Cardiovascular: Normal rate, regular rhythm and normal heart sounds.   Pulmonary/Chest: Effort normal and breath sounds normal.  Abdominal: Soft.  Neurological: He is alert and oriented to person, place, and time.  Skin: Skin is warm and dry.  Psychiatric: Mood, memory, affect and judgment normal.    Assessment and Plan :   1. Essential (primary) hypertension  - CBC with Differential/Platelet - Comprehensive metabolic panel - TSH  2. Type 2 diabetes mellitus without complication, without long-term current use of insulin (HCC)  - Comprehensive metabolic panel - Hemoglobin A1c  3. HLD (hyperlipidemia)  - Comprehensive metabolic panel - Lipid Panel With LDL/HDL Ratio  4. Colon cancer screening  - Ambulatory referral to Gastroenterology  Patient was seen and examined by Dr. Delfino Lovett L. Cranford Mon and the note was scribed by Althea Charon, RMA.  I have done the exam and reviewed the above chart and it is accurate to the best of my knowledge.  Miguel Aschoff MD [facility] 12/25/2015 8:37 AM

## 2015-12-26 LAB — COMPREHENSIVE METABOLIC PANEL
ALBUMIN: 4.4 g/dL (ref 3.5–4.8)
ALK PHOS: 58 IU/L (ref 39–117)
ALT: 13 IU/L (ref 0–44)
AST: 13 IU/L (ref 0–40)
Albumin/Globulin Ratio: 1.5 (ref 1.2–2.2)
BUN / CREAT RATIO: 14 (ref 10–24)
BUN: 16 mg/dL (ref 8–27)
Bilirubin Total: 0.8 mg/dL (ref 0.0–1.2)
CALCIUM: 9.9 mg/dL (ref 8.6–10.2)
CO2: 22 mmol/L (ref 18–29)
CREATININE: 1.16 mg/dL (ref 0.76–1.27)
Chloride: 102 mmol/L (ref 96–106)
GFR calc Af Amer: 73 mL/min/{1.73_m2} (ref >59)
GFR, EST NON AFRICAN AMERICAN: 63 mL/min/{1.73_m2} (ref >59)
GLUCOSE: 209 mg/dL — AB (ref 65–99)
Globulin, Total: 2.9 g/dL (ref 1.5–4.5)
Potassium: 5.1 mmol/L (ref 3.5–5.2)
Sodium: 140 mmol/L (ref 134–144)
Total Protein: 7.3 g/dL (ref 6.0–8.5)

## 2015-12-26 LAB — CBC WITH DIFFERENTIAL/PLATELET
BASOS ABS: 0 10*3/uL (ref 0.0–0.2)
Basos: 0 %
EOS (ABSOLUTE): 0.1 10*3/uL (ref 0.0–0.4)
EOS: 2 %
HEMOGLOBIN: 13.4 g/dL (ref 12.6–17.7)
Hematocrit: 40.4 % (ref 37.5–51.0)
IMMATURE GRANS (ABS): 0 10*3/uL (ref 0.0–0.1)
IMMATURE GRANULOCYTES: 0 %
LYMPHS ABS: 3 10*3/uL (ref 0.7–3.1)
LYMPHS: 37 %
MCH: 31.6 pg (ref 26.6–33.0)
MCHC: 33.2 g/dL (ref 31.5–35.7)
MCV: 95 fL (ref 79–97)
MONOCYTES: 8 %
Monocytes Absolute: 0.6 10*3/uL (ref 0.1–0.9)
NEUTROS PCT: 53 %
Neutrophils Absolute: 4.3 10*3/uL (ref 1.4–7.0)
Platelets: 249 10*3/uL (ref 150–379)
RBC: 4.24 x10E6/uL (ref 4.14–5.80)
RDW: 13.4 % (ref 12.3–15.4)
WBC: 8.1 10*3/uL (ref 3.4–10.8)

## 2015-12-26 LAB — LIPID PANEL WITH LDL/HDL RATIO
Cholesterol, Total: 112 mg/dL (ref 100–199)
HDL: 37 mg/dL — AB (ref >39)
LDL CALC: 55 mg/dL (ref 0–99)
LDL/HDL RATIO: 1.5 ratio (ref 0.0–3.6)
TRIGLYCERIDES: 100 mg/dL (ref 0–149)
VLDL Cholesterol Cal: 20 mg/dL (ref 5–40)

## 2015-12-26 LAB — TSH: TSH: 2.08 u[IU]/mL (ref 0.450–4.500)

## 2015-12-26 LAB — HEMOGLOBIN A1C
Est. average glucose Bld gHb Est-mCnc: 197 mg/dL
Hgb A1c MFr Bld: 8.5 % — ABNORMAL HIGH (ref 4.8–5.6)

## 2016-01-12 ENCOUNTER — Ambulatory Visit (HOSPITAL_COMMUNITY)
Admission: RE | Admit: 2016-01-12 | Discharge: 2016-01-12 | Disposition: A | Discharge status: Home / Self Care | Payer: PPO | Source: Ambulatory Visit | Attending: Urology | Admitting: Urology

## 2016-01-12 DIAGNOSIS — C61 Malignant neoplasm of prostate: Secondary | ICD-10-CM | POA: Insufficient documentation

## 2016-01-12 DIAGNOSIS — R938 Abnormal findings on diagnostic imaging of other specified body structures: Secondary | ICD-10-CM | POA: Diagnosis not present

## 2016-01-12 MED ORDER — GADOBENATE DIMEGLUMINE 529 MG/ML IV SOLN
20.0000 mL | Freq: Once | INTRAVENOUS | Status: AC | PRN
  Administered 2016-01-12: 19 mL via INTRAVENOUS

## 2016-01-18 ENCOUNTER — Telehealth: Payer: Self-pay | Admitting: Urology

## 2016-01-18 NOTE — Telephone Encounter (Signed)
Reviewed prostate MRI results with the patient. No suspicious lesions or areas concerning for high-grade tumor. I'm reassured by this at this time and believe that continued active surveillance is reasonable. He is a follow-up appointment in December for repeat PSA/DRE which I reminded him to keep. He is agreeable with this plan.  Hollice Espy, MD

## 2016-02-12 NOTE — Telephone Encounter (Signed)
error 

## 2016-03-21 ENCOUNTER — Ambulatory Visit: Payer: PPO | Admitting: Anesthesiology

## 2016-03-21 ENCOUNTER — Ambulatory Visit
Admission: RE | Admit: 2016-03-21 | Discharge: 2016-03-21 | Disposition: A | Discharge status: Home / Self Care | Payer: PPO | Source: Ambulatory Visit | Attending: Unknown Physician Specialty | Admitting: Unknown Physician Specialty

## 2016-03-21 ENCOUNTER — Encounter: Payer: Self-pay | Admitting: Anesthesiology

## 2016-03-21 ENCOUNTER — Encounter
Admission: RE | Disposition: A | Discharge status: Home / Self Care | Payer: Self-pay | Source: Ambulatory Visit | Attending: Unknown Physician Specialty

## 2016-03-21 DIAGNOSIS — D120 Benign neoplasm of cecum: Secondary | ICD-10-CM | POA: Diagnosis not present

## 2016-03-21 DIAGNOSIS — K635 Polyp of colon: Secondary | ICD-10-CM | POA: Diagnosis not present

## 2016-03-21 DIAGNOSIS — Z7984 Long term (current) use of oral hypoglycemic drugs: Secondary | ICD-10-CM | POA: Insufficient documentation

## 2016-03-21 DIAGNOSIS — F419 Anxiety disorder, unspecified: Secondary | ICD-10-CM | POA: Insufficient documentation

## 2016-03-21 DIAGNOSIS — Z1211 Encounter for screening for malignant neoplasm of colon: Secondary | ICD-10-CM | POA: Diagnosis not present

## 2016-03-21 DIAGNOSIS — I10 Essential (primary) hypertension: Secondary | ICD-10-CM | POA: Diagnosis not present

## 2016-03-21 DIAGNOSIS — Z7982 Long term (current) use of aspirin: Secondary | ICD-10-CM | POA: Diagnosis not present

## 2016-03-21 DIAGNOSIS — Z8719 Personal history of other diseases of the digestive system: Secondary | ICD-10-CM | POA: Diagnosis not present

## 2016-03-21 DIAGNOSIS — E1151 Type 2 diabetes mellitus with diabetic peripheral angiopathy without gangrene: Secondary | ICD-10-CM | POA: Insufficient documentation

## 2016-03-21 DIAGNOSIS — G4733 Obstructive sleep apnea (adult) (pediatric): Secondary | ICD-10-CM | POA: Diagnosis not present

## 2016-03-21 DIAGNOSIS — E785 Hyperlipidemia, unspecified: Secondary | ICD-10-CM | POA: Insufficient documentation

## 2016-03-21 DIAGNOSIS — K640 First degree hemorrhoids: Secondary | ICD-10-CM | POA: Insufficient documentation

## 2016-03-21 DIAGNOSIS — Z800 Family history of malignant neoplasm of digestive organs: Secondary | ICD-10-CM | POA: Diagnosis not present

## 2016-03-21 DIAGNOSIS — I739 Peripheral vascular disease, unspecified: Secondary | ICD-10-CM | POA: Diagnosis not present

## 2016-03-21 DIAGNOSIS — Z87891 Personal history of nicotine dependence: Secondary | ICD-10-CM | POA: Diagnosis not present

## 2016-03-21 DIAGNOSIS — Z86010 Personal history of colonic polyps: Secondary | ICD-10-CM | POA: Diagnosis not present

## 2016-03-21 DIAGNOSIS — Z79899 Other long term (current) drug therapy: Secondary | ICD-10-CM | POA: Diagnosis not present

## 2016-03-21 HISTORY — DX: Anxiety disorder, unspecified: F41.9

## 2016-03-21 HISTORY — PX: COLONOSCOPY WITH PROPOFOL: SHX5780

## 2016-03-21 HISTORY — DX: Hyperlipidemia, unspecified: E78.5

## 2016-03-21 HISTORY — DX: Central retinal vein occlusion, unspecified eye, stable: H34.8192

## 2016-03-21 HISTORY — DX: Pruritus, unspecified: L29.9

## 2016-03-21 HISTORY — DX: Dermatitis, unspecified: L30.9

## 2016-03-21 LAB — GLUCOSE, CAPILLARY: GLUCOSE-CAPILLARY: 223 mg/dL — AB (ref 65–99)

## 2016-03-21 LAB — SURGICAL PATHOLOGY

## 2016-03-21 SURGERY — COLONOSCOPY WITH PROPOFOL
Anesthesia: General

## 2016-03-21 MED ORDER — PROPOFOL 500 MG/50ML IV EMUL
INTRAVENOUS | Status: DC | PRN
  Administered 2016-03-21: 120 ug/kg/min via INTRAVENOUS

## 2016-03-21 MED ORDER — FENTANYL CITRATE (PF) 100 MCG/2ML IJ SOLN
INTRAMUSCULAR | Status: DC | PRN
  Administered 2016-03-21: 50 ug via INTRAVENOUS

## 2016-03-21 MED ORDER — EPHEDRINE SULFATE 50 MG/ML IJ SOLN
INTRAMUSCULAR | Status: DC | PRN
  Administered 2016-03-21: 10 mg via INTRAVENOUS

## 2016-03-21 MED ORDER — MIDAZOLAM HCL 2 MG/2ML IJ SOLN
INTRAMUSCULAR | Status: DC | PRN
  Administered 2016-03-21: 1 mg via INTRAVENOUS

## 2016-03-21 MED ORDER — SODIUM CHLORIDE 0.9 % IV SOLN: INTRAVENOUS | Status: DC

## 2016-03-21 MED ORDER — SODIUM CHLORIDE 0.9 % IV SOLN
INTRAVENOUS | Status: DC
  Administered 2016-03-21: 10:00:00 via INTRAVENOUS

## 2016-03-21 NOTE — Transfer of Care (Signed)
Immediate Anesthesia Transfer of Care Note  Patient: Juan Mack  Procedure(s) Performed: Procedure(s): COLONOSCOPY WITH PROPOFOL (N/A)  Patient Location: PACU  Anesthesia Type:General  Level of Consciousness: awake, alert , oriented and sedated  Airway & Oxygen Therapy: Patient Spontanous Breathing and Patient connected to nasal cannula oxygen  Post-op Assessment: Report given to RN and Post -op Vital signs reviewed and stable  Post vital signs: Reviewed and stable  Last Vitals:  Vitals:   03/21/16 0936 03/21/16 1200  BP: (!) 151/60 (!) (P) 117/40  Pulse: 73 (P) 71  Resp: 14 (P) 16  Temp: 36.6 C (!) (P) 35.5 C    Last Pain:  Vitals:   03/21/16 1200  TempSrc: (P) Tympanic         Complications: No apparent anesthesia complications

## 2016-03-21 NOTE — Anesthesia Postprocedure Evaluation (Signed)
Anesthesia Post Note  Patient: Juan Mack  Procedure(s) Performed: Procedure(s) (LRB): COLONOSCOPY WITH PROPOFOL (N/A)  Patient location during evaluation: Endoscopy Anesthesia Type: General Level of consciousness: awake and alert Pain management: pain level controlled Vital Signs Assessment: post-procedure vital signs reviewed and stable Respiratory status: spontaneous breathing and respiratory function stable Cardiovascular status: stable Anesthetic complications: no    Last Vitals:  Vitals:   03/21/16 0936 03/21/16 1200  BP: (!) 151/60 (!) 117/40  Pulse: 73 71  Resp: 14 16  Temp: 36.6 C (!) 35.5 C    Last Pain:  Vitals:   03/21/16 1200  TempSrc: Tympanic                 [name] K

## 2016-03-21 NOTE — Anesthesia Preprocedure Evaluation (Signed)
Anesthesia Evaluation  Patient identified by MRN, date of birth, ID band Patient awake    Reviewed: Allergy & Precautions, NPO status , Patient's Chart, lab work & pertinent test results  History of Anesthesia Complications Negative for: history of anesthetic complications  Airway Mallampati: II       Dental   Pulmonary sleep apnea (not using CPAP) , former smoker,           Cardiovascular hypertension, Pt. on medications + Peripheral Vascular Disease       Neuro/Psych Anxiety negative neurological ROS     GI/Hepatic Neg liver ROS, GERD (occasional)  ,  Endo/Other  diabetes, Type 2, Oral Hypoglycemic Agents  Renal/GU negative Renal ROS     Musculoskeletal   Abdominal   Peds  Hematology negative hematology ROS (+)   Anesthesia Other Findings   Reproductive/Obstetrics                             Anesthesia Physical Anesthesia Plan  ASA: III  Anesthesia Plan: General   Post-op Pain Management:    Induction: Intravenous  Airway Management Planned: Nasal Cannula  Additional Equipment:   Intra-op Plan:   Post-operative Plan:   Informed Consent: I have reviewed the patients History and Physical, chart, labs and discussed the procedure including the risks, benefits and alternatives for the proposed anesthesia with the patient or authorized representative who has indicated his/her understanding and acceptance.     Plan Discussed with:   Anesthesia Plan Comments:         Anesthesia Quick Evaluation

## 2016-03-21 NOTE — Anesthesia Procedure Notes (Signed)
Performed by: COOK-MARTIN, Lucina Betty Ventilation: Oral airway inserted - appropriate to patient size       

## 2016-03-21 NOTE — Op Note (Signed)
[facility]ology Patient Name: Juan Mack Procedure Date: 03/21/2016 10:54 AM MRN: [id number] Account #: [id number] Date of Birth: 09/25/1945 Admit Type: Outpatient Age: 70 Room: [facility] ENDO ROOM 1 Gender: Male Note Status: Finalized Procedure:            Colonoscopy Indications:          High risk colon cancer surveillance: Personal history                        of colonic polyps, Family history of colon cancer in a                        first-degree relative Providers:            Manya Silvas, MD Referring MD:         Janine Ores. Rosanna Randy, MD (Referring MD) Medicines:            Propofol per Anesthesia Complications:        No immediate complications. Procedure:            Pre-Anesthesia Assessment:                       - After reviewing the risks and benefits, the patient                        was deemed in satisfactory condition to undergo the                        procedure.                       After obtaining informed consent, the colonoscope was                        passed under direct vision. Throughout the procedure,                        the patient's blood pressure, pulse, and oxygen                        saturations were monitored continuously. The                        Colonoscope was introduced through the anus and                        advanced to the the cecum, identified by appendiceal                        orifice and ileocecal valve. The colonoscopy was                        technically difficult and complex due to Position of                        polyp in cecum. Findings:      A 14 mm polyp was found in the cecum. The polyp was sessile. The polyp       was removed with a saline injection-lift technique using a hot snare.       Resection and retrieval were complete. The polyp  was very difficult to       remove, I injected 20cc of saline and it did lift some and I used cold       forceps, and argon to finish the  edges. No other polyps seen in colon.      Internal hemorrhoids were found during endoscopy. The hemorrhoids were       small and Grade I (internal hemorrhoids that do not prolapse). Impression:           - One 14 mm polyp in the cecum, removed using                        injection-lift and a hot snare. Resected and retrieved. Recommendation:       - Await pathology results. Probably repeat in 6 months                        or so to be sure of compete removal. Manya Silvas, MD 03/21/2016 12:03:00 PM This report has been signed electronically. Number of Addenda: 0 Note Initiated On: 03/21/2016 10:54 AM Scope Withdrawal Time: 0 hours 46 minutes 45 seconds  Total Procedure Duration: 0 hours 56 minutes 38 seconds       [facility]

## 2016-03-21 NOTE — H&P (Signed)
Primary Care Physician:  Wilhemena Durie, MD Primary Gastroenterologist:  Dr. Vira Agar  Pre-Procedure History & Physical: HPI:  Juan Mack is a 70 y.o. male is here for an colonoscopy.   Past Medical History:  Diagnosis Date  . Anxiety   . Anxiety disorder   . BCC (basal cell carcinoma)   . Central vein occlusion of retina   . Dermatitis   . Diabetes mellitus, type II ([place])   . Elevated PSA   . Hyperlipemia   . Hyperlipidemia   . Hypertension   . Insomnia   . Insomnia   . OSA on CPAP   . Prostate nodule   . Pruritus   . Stable central retinal vein occlusion     Past Surgical History:  Procedure Laterality Date  . NO PAST SURGERIES      Prior to Admission medications   Medication Sig Start Date End Date Taking? Authorizing Provider  amLODipine (NORVASC) 5 MG tablet TAKE 1 TABLET BY MOUTH EVERY DAY 03/27/15  Yes Jerrol Banana., MD  aspirin 81 MG EC tablet Take 81 mg by mouth daily. Swallow whole.   Yes Historical Provider, MD  atorvastatin (LIPITOR) 20 MG tablet Take 1 tablet (20 mg total) by mouth daily. 12/19/15  Yes Richard Maceo Pro., MD  benazepril (LOTENSIN) 40 MG tablet TAKE 1 TABLET BY MOUTH EVERY DAY 03/27/15  Yes Richard Maceo Pro., MD  latanoprost (XALATAN) 0.005 % ophthalmic solution Place 1 drop into both eyes at bedtime.   Yes Historical Provider, MD  metFORMIN (GLUCOPHAGE) 1000 MG tablet TAKE 1 TABLET BY MOUTH TWICE DAILY 11/11/15  Yes Jerrol Banana., MD  pioglitazone (ACTOS) 30 MG tablet TAKE 1 TABLET BY MOUTH EVERY DAY 09/11/15  Yes Jerrol Banana., MD  diazepam (VALIUM) 10 MG tablet Take 1 tablet (10 mg total) by mouth every 6 (six) hours as needed for anxiety. 12/22/15   Hollice Espy, MD  glucose blood test strip Use as instructed 10/24/15   Jerrol Banana., MD    Allergies as of 02/14/2016  . (No Known Allergies)    Family History  Problem Relation Age of Onset  . Prostate cancer Brother     Father  . Diabetes  Brother   . Colon cancer Brother   . Pancreatic cancer Sister   . Lung cancer Sister   . Coronary artery disease Father   . Heart disease Father   . Congestive Heart Failure Mother   . Diabetes Mother     Social History   Social History  . Marital status: Married    Spouse name: N/A  . Number of children: N/A  . Years of education: N/A   Occupational History  . Not on file.   Social History Main Topics  . Smoking status: Former Smoker    Years: 14.00    Types: Cigarettes    Quit date: 07/08/1973  . Smokeless tobacco: Never Used  . Alcohol use No  . Drug use: No  . Sexual activity: Not on file   Other Topics Concern  . Not on file   Social History Narrative  . No narrative on file    Review of Systems: See HPI, otherwise negative ROS  Physical Exam: BP (!) 151/60   Pulse 73   Temp 97.9 F (36.6 C) (Tympanic)   Resp 14   Ht 5\' 10"  (1.778 m)   Wt 93 kg (205 lb)   SpO2 100%  BMI 29.41 kg/m  General:   Alert,  pleasant and cooperative in NAD Head:  Normocephalic and atraumatic. Neck:  Supple; no masses or thyromegaly. Lungs:  Clear throughout to auscultation.    Heart:  Regular rate and rhythm. Abdomen:  Soft, nontender and nondistended. Normal bowel sounds, without guarding, and without rebound.   Neurologic:  Alert and  oriented x4;  grossly normal neurologically.  Impression/Plan: LILTON NOLASCO is here for an colonoscopy to be performed for [facility] colon polyps.  Risks, benefits, limitations, and alternatives regarding  colonoscopy have been reviewed with the patient.  Questions have been answered.  All parties agreeable.   Gaylyn Cheers, MD  03/21/2016, 10:52 AM

## 2016-03-21 NOTE — Anesthesia Procedure Notes (Signed)
Performed by: COOK-MARTIN, Gregory Dowe Pre-anesthesia Checklist: Patient identified, Emergency Drugs available, Suction available, Patient being monitored and Timeout performed Patient Re-evaluated:Patient Re-evaluated prior to inductionOxygen Delivery Method: Nasal cannula Preoxygenation: Pre-oxygenation with 100% oxygen Intubation Type: IV induction Placement Confirmation: positive ETCO2 and CO2 detector       

## 2016-04-12 ENCOUNTER — Encounter: Payer: Self-pay | Admitting: Family Medicine

## 2016-04-23 ENCOUNTER — Other Ambulatory Visit: Payer: Self-pay | Admitting: Family Medicine

## 2016-04-30 DIAGNOSIS — H401131 Primary open-angle glaucoma, bilateral, mild stage: Secondary | ICD-10-CM | POA: Diagnosis not present

## 2016-05-06 ENCOUNTER — Ambulatory Visit: Payer: PPO | Admitting: Family Medicine

## 2016-05-07 DIAGNOSIS — H401131 Primary open-angle glaucoma, bilateral, mild stage: Secondary | ICD-10-CM | POA: Diagnosis not present

## 2016-05-20 ENCOUNTER — Ambulatory Visit (INDEPENDENT_AMBULATORY_CARE_PROVIDER_SITE_OTHER): Payer: PPO | Admitting: Family Medicine

## 2016-05-20 VITALS — BP 148/62 | HR 72 | Temp 98.1°F | Resp 16 | Wt 212.0 lb

## 2016-05-20 DIAGNOSIS — I10 Essential (primary) hypertension: Secondary | ICD-10-CM | POA: Diagnosis not present

## 2016-05-20 DIAGNOSIS — E119 Type 2 diabetes mellitus without complications: Secondary | ICD-10-CM

## 2016-05-20 LAB — POCT GLYCOSYLATED HEMOGLOBIN (HGB A1C): Hemoglobin A1C: 8.2

## 2016-05-20 NOTE — Progress Notes (Signed)
Juan Mack  MRN: [id number] DOB: April 08, 1946  Subjective:  HPI   1. Type 2 diabetes mellitus without complication, without long-term current use of insulin [facility]) The patient is a 70 year old male who presents for follow up of his diabetes.  He was last seen on 12/25/15 and his A1C at that time was 8.5.  He reports that he checks his glucose and has been getting readings that range 160s-170s fasting and then in the afternoon after he has been active they range 90-120.   He had his last eye exam 2 weeks ago and states he does check his feet on a regular basis for cuts and sores.  His last foot exam with [place] was in August of 2016.  The patient denies any episodes or symptoms suggestive of hypoglycemia.  2. Essential (primary) hypertension The patient has also come in for follow up of his hypertension.  He states he will occasionally have it checked somewhere like [employer].  However he has been stable for some time and said that he really doesn't check it that often.  The patient has already had his flu and pneumonia vaccines. He is due for his foot exam today    Patient Active Problem List   Diagnosis Date Noted  . Basal cell carcinoma of skin 12/01/2014  . 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 Special screening for malignant neoplasms, colon 12/01/2014  . Dermatitis, eczematoid 12/01/2014  . Polypharmacy 12/01/2014  . Essential (primary) hypertension 12/01/2014  . Anxiety, generalized 12/01/2014  . Flu vaccine need 12/01/2014  . HLD (hyperlipidemia) 12/01/2014  . Cannot sleep 12/01/2014  . Pruritic disorder 12/01/2014  . Obstructive apnea 12/01/2014  . Special screening for malignant neoplasm of prostate 12/01/2014  . Prostate lump 12/01/2014  . Scabies 12/01/2014  . Diabetes mellitus, type 2 ([place]) 12/01/2014    Past Medical History:  Diagnosis Date  . Anxiety   . Anxiety disorder   . BCC (basal cell carcinoma)   . Central vein occlusion of retina   . Dermatitis    . Diabetes mellitus, type II ([place])   . Elevated PSA   . Hyperlipemia   . Hyperlipidemia   . Hypertension   . Insomnia   . Insomnia   . OSA on CPAP   . Prostate nodule   . Pruritus   . Stable central retinal vein occlusion     Social History   Social History  . Marital status: Married    Spouse name: N/A  . Number of children: N/A  . Years of education: N/A   Occupational History  . Not on file.   Social History Main Topics  . Smoking status: Former Smoker    Years: 14.00    Types: Cigarettes    Quit date: 07/08/1973  . Smokeless tobacco: Never Used  . Alcohol use No  . Drug use: No  . Sexual activity: Not on file   Other Topics Concern  . Not on file   Social History Narrative  . No narrative on file    Outpatient Encounter Prescriptions as of 05/20/2016  Medication Sig  . amLODipine (NORVASC) 5 MG tablet TAKE 1 TABLET BY MOUTH EVERY DAY  . aspirin 81 MG EC tablet Take 81 mg by mouth daily. Swallow whole.  Marland Kitchen atorvastatin (LIPITOR) 20 MG tablet Take 1 tablet (20 mg total) by mouth daily.  . benazepril (LOTENSIN) 40 MG tablet TAKE 1 TABLET BY MOUTH EVERY DAY  . glucose blood test strip  Use as instructed  . latanoprost (XALATAN) 0.005 % ophthalmic solution Place 1 drop into both eyes at bedtime.  . metFORMIN (GLUCOPHAGE) 1000 MG tablet TAKE 1 TABLET BY MOUTH TWICE DAILY  . pioglitazone (ACTOS) 30 MG tablet TAKE 1 TABLET BY MOUTH EVERY DAY  . [DISCONTINUED] diazepam (VALIUM) 10 MG tablet Take 1 tablet (10 mg total) by mouth every 6 (six) hours as needed for anxiety.   No facility-administered encounter medications on file as of 05/20/2016.     No Known Allergies  Review of Systems  Constitutional: Negative for chills, fever and malaise/fatigue.  Respiratory: Negative for cough, shortness of breath and wheezing.   Cardiovascular: Negative for chest pain, palpitations, orthopnea, claudication, leg swelling and PND.  Genitourinary: Negative for frequency.    Neurological: Negative for dizziness, weakness and headaches.  Endo/Heme/Allergies: Negative for polydipsia.    Objective:  BP (!) 148/62 (BP Location: Right Arm, Patient Position: Sitting, Cuff Size: Normal)   Pulse 72   Temp 98.1 F (36.7 C) (Oral)   Resp 16   Wt 212 lb (96.2 kg)   BMI 30.42 kg/m   Physical Exam  Constitutional: He is oriented to person, place, and time and well-developed, well-nourished, and in no distress.  HENT:  Head: Normocephalic and atraumatic.  Left Ear: External ear normal.  Nose: Nose normal.  Eyes: Conjunctivae are normal. Pupils are equal, round, and reactive to light.  Neck: Normal range of motion. Neck supple.  Cardiovascular: Normal rate, regular rhythm and normal heart sounds.   Pulmonary/Chest: Effort normal and breath sounds normal.  Abdominal: Soft.  Neurological: He is alert and oriented to person, place, and time. Gait normal. GCS score is 15.  Skin: Skin is warm and dry.  Psychiatric: Mood, memory, affect and judgment normal.   Diabetic Foot Exam - Simple   Simple Foot Form Diabetic Foot exam was performed with the following findings:  Yes 05/20/2016 10:36 AM  Visual Inspection No deformities, no ulcerations, no other skin breakdown bilaterally:  Yes Sensation Testing Intact to touch and monofilament testing bilaterally:  Yes Pulse Check Posterior Tibialis and Dorsalis pulse intact bilaterally:  Yes Comments      Assessment and Plan :   1. Type 2 diabetes mellitus without complication, without long-term current use of insulin (HCC) Improving A1c. Goal is 7.0-7.5. - POCT glycosylated hemoglobin (Hb A1C)--8.2  2. Essential (primary) hypertension Controlled.   HPI, Exam and A&P Transcribed under the direction and in the presence of Miguel Aschoff, Brooke Bonito., MD. Electronically Signed: Althea Charon, RMA I have done the exam and reviewed the chart and it is accurate to the best of my knowledge. Development worker, community has been used  and  any errors in dictation or transcription are unintentional. Miguel Aschoff M.D. [facility]

## 2016-06-24 ENCOUNTER — Ambulatory Visit: Payer: PPO | Admitting: Urology

## 2016-06-25 ENCOUNTER — Other Ambulatory Visit: Payer: PPO

## 2016-06-25 DIAGNOSIS — C61 Malignant neoplasm of prostate: Secondary | ICD-10-CM

## 2016-06-26 LAB — PSA: Prostate Specific Ag, Serum: 2 ng/mL (ref 0.0–4.0)

## 2016-06-28 ENCOUNTER — Encounter: Payer: Self-pay | Admitting: Urology

## 2016-06-28 ENCOUNTER — Ambulatory Visit: Payer: PPO | Admitting: Urology

## 2016-06-28 VITALS — BP 178/77 | HR 73 | Ht 70.0 in | Wt 212.0 lb

## 2016-06-28 DIAGNOSIS — C61 Malignant neoplasm of prostate: Secondary | ICD-10-CM | POA: Diagnosis not present

## 2016-06-28 NOTE — Progress Notes (Addendum)
8:36 AM  06/28/16   Juan Mack 15-Oct-1945 [id number]  Referring provider: Jerrol Banana., MD [address] [place], [place] [address]  CC: Elevated PSA  HPI: 70 y.o. 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  40 g prostate with small right sided nodule.    Most recent PSA has remains stable around 2- 2.5 ng/dL.    Endorectal MRI  01/2016 unremarkable.    He has had no changes GU status since his last visit.  No urinary complaints.     PMH: Past Medical History:  Diagnosis Date  . Anxiety   . Anxiety disorder   . BCC (basal cell carcinoma)   . Central vein occlusion of retina   . Dermatitis   . Diabetes mellitus, type II ([place])   . Elevated PSA   . Hyperlipemia   . Hyperlipidemia   . Hypertension   . Insomnia   . Insomnia   . OSA on CPAP   . Prostate nodule   . Pruritus   . Stable central retinal vein occlusion     Surgical History: Past Surgical History:  Procedure Laterality Date  . COLONOSCOPY WITH PROPOFOL N/A 03/21/2016   Procedure: COLONOSCOPY WITH PROPOFOL;  Surgeon: Manya Silvas, MD;  Location: [facility] ENDOSCOPY;  Service: Endoscopy;  Laterality: N/A;  . NO PAST SURGERIES      Home Medications:  Allergies as of 06/28/2016   No Known Allergies     Medication List       Accurate as of 06/28/16  8:36 AM. Always use your most recent med list.          amLODipine 5 MG tablet Commonly known as:  NORVASC TAKE 1 TABLET BY MOUTH EVERY DAY   aspirin 81 MG EC tablet Take 81 mg by mouth daily. Swallow whole.   atorvastatin 20 MG tablet Commonly known as:  LIPITOR Take 1 tablet (20 mg total) by mouth daily.   benazepril 40 MG tablet Commonly known as:  LOTENSIN TAKE 1 TABLET BY MOUTH EVERY DAY   glucose blood test strip Use as instructed   latanoprost 0.005 % ophthalmic  solution Commonly known as:  XALATAN Place 1 drop into both eyes at bedtime.   metFORMIN 1000 MG tablet Commonly known as:  GLUCOPHAGE TAKE 1 TABLET BY MOUTH TWICE DAILY   pioglitazone 30 MG tablet Commonly known as:  ACTOS TAKE 1 TABLET BY MOUTH EVERY DAY       Allergies: No Known Allergies  Family History: Family History  Problem Relation Age of Onset  . Prostate cancer Brother     Father  . Diabetes Brother   . Colon cancer Brother   . Pancreatic cancer Sister   . Lung cancer Sister   . Coronary artery disease Father   . Heart disease Father   . Congestive Heart Failure Mother   . Diabetes Mother     Social History:  reports that he quit smoking about 43 years ago. His smoking use included Cigarettes. He quit after 14.00 years of use. He has never used smokeless tobacco. He reports that he does not drink alcohol or use drugs.  ROS: UROLOGY Frequent Urination?: No Hard to postpone urination?: No Burning/pain with urination?: No Get up at night to urinate?: No Leakage of urine?: No Urine stream starts and  stops?: No Trouble starting stream?: No Do you have to strain to urinate?: No Blood in urine?: No Urinary tract infection?: No Sexually transmitted disease?: No Injury to kidneys or bladder?: No Painful intercourse?: No Weak stream?: No Erection problems?: No Penile pain?: No  Gastrointestinal Nausea?: No Vomiting?: No Indigestion/heartburn?: No Diarrhea?: No Constipation?: No  Constitutional Fever: No Night sweats?: No Weight loss?: No Fatigue?: No  Skin Skin rash/lesions?: No Itching?: No  Eyes Blurred vision?: No Double vision?: No  Ears/Nose/Throat Sore throat?: No Sinus problems?: No  Hematologic/Lymphatic Swollen glands?: No Easy bruising?: No  Cardiovascular Leg swelling?: No Chest pain?: No  Respiratory Cough?: No Shortness of breath?: No  Endocrine Excessive thirst?: No  Musculoskeletal Back pain?: No Joint  pain?: No  Neurological Headaches?: No Dizziness?: No  Psychologic Depression?: No Anxiety?: No  Physical Exam: BP (!) 178/77   Pulse 73   Ht 5\' 10"  (1.778 m)   Wt 212 lb (96.2 kg)   BMI 30.42 kg/m   Constitutional:  Alert and oriented, No acute distress.  Accompanied by wife today.   HEENT: [place] AT, moist mucus membranes.  Trachea midline, no masses. Cardiovascular: No clubbing, cyanosis, or edema. Respiratory: Normal respiratory effort, no increased work of breathing. GI: Abdomen is soft, nontender, nondistended, no abdominal masses GU: No CVA tenderness.  normal phallus. Testicles descended equally bilaterally. No masses.  DRE: 2+, induration noted at the right base which was documented on his previous exam.  Stable.   Skin: No rashes, bruises or suspicious lesions.. Neurologic: Grossly intact, no focal deficits, moving all 4 extremities. Psychiatric: Normal mood and affect.  Laboratory Data: Lab Results  Component Value Date   WBC 8.1 12/25/2015   HGB 14.2 09/24/2013   HCT 40.4 12/25/2015   MCV 95 12/25/2015   PLT 249 12/25/2015    Lab Results  Component Value Date   CREATININE 1.16 12/25/2015   Component     Latest Ref Rng & Units 06/12/2015 12/15/2015 06/25/2016  PSA     0.0 - 4.0 ng/mL 2.4 2.3 2.0    Lab Results  Component Value Date   HGBA1C 8.2 05/20/2016   Imaging: CLINICAL DATA:  Low risk prostate cancer. Active surveillance. Elevated PSA.  EXAM: MR PROSTATE WITHOUT AND WITH CONTRAST  TECHNIQUE: Multiplanar multisequence MRI images were obtained of the pelvis centered about the prostate. Pre and post contrast images were obtained.  CONTRAST:  74mL MULTIHANCE GADOBENATE DIMEGLUMINE 529 MG/ML IV SOLN  COMPARISON:  Biopsy results of 12/06/2014.  FINDINGS: Prostate: Moderate central gland enlargement and heterogeneity consistent with benign prostatic hyperplasia. Mild nonspecific relatively diffuse heterogeneous peripheral zone T2 signal.  No areas of masslike focal T2 hypo intensity identified. No restricted diffusion. Mild, somewhat geographic early post-contrast enhancement is identified within the right peripheral zone at the apex. Example images 44 and 45/ series 11302.  Transcapsular spread:  Absent  Seminal vesicle involvement: Absent  Neurovascular bundle involvement: Absent  Pelvic adenopathy: Absent  Bone metastasis: Absent  Other findings: Colonic diverticulosis. No significant free fluid. Normal urinary bladder.  IMPRESSION: 1. No evidence of high-grade or macroscopic percarcinoma. 2. Nonspecific heterogeneous peripheral zone T2 signal could relate to prior prostatitis. Similarly, geographic right apical peripheral zone post-contrast enhancement is nonspecific but also could relate to prior infection.   Electronically Signed   By: Abigail Miyamoto M.D.   On: 01/12/2016 17:13    Assessment & Plan:    1. Very low risk prostate cancer, stable PSA and exam -Recommend continued active  surveillance  -f/u in 6 months with PSA prior  Return in about 6 months (around 12/27/2016) for PSA/ DRE.  Hollice Espy, MD  [facility] [address] [place], [place] [address] [phone]

## 2016-09-18 ENCOUNTER — Ambulatory Visit (INDEPENDENT_AMBULATORY_CARE_PROVIDER_SITE_OTHER): Payer: PPO | Admitting: Family Medicine

## 2016-09-18 ENCOUNTER — Encounter: Payer: Self-pay | Admitting: Family Medicine

## 2016-09-18 VITALS — BP 130/64 | HR 80 | Temp 98.2°F | Resp 16 | Wt 211.0 lb

## 2016-09-18 DIAGNOSIS — E119 Type 2 diabetes mellitus without complications: Secondary | ICD-10-CM | POA: Diagnosis not present

## 2016-09-18 LAB — POCT GLYCOSYLATED HEMOGLOBIN (HGB A1C)
ESTIMATED AVERAGE GLUCOSE: 212
Hemoglobin A1C: 9

## 2016-09-18 MED ORDER — PIOGLITAZONE HCL 45 MG PO TABS: 45.0000 mg | ORAL_TABLET | Freq: Every day | ORAL | 3 refills | Status: DC

## 2016-09-18 NOTE — Progress Notes (Signed)
Patient: Juan Mack Male    DOB: April 14, 1946   71 y.o.   MRN: [id number] Visit Date: 09/18/2016  Today's Provider: Wilhemena Durie, MD   Chief Complaint  Patient presents with  . Diabetes   Subjective:    HPI      Diabetes Mellitus Type II, Follow-up:   Lab Results  Component Value Date   HGBA1C 8.2 05/20/2016   HGBA1C 8.5 (H) 12/25/2015   HGBA1C 8.6 06/22/2015    Last seen for diabetes 4 months ago.  Management since then includes none. He reports excellent compliance with treatment. He is not having side effects.  Current symptoms include none and have been stable. Home blood sugar records: fasting range: 160-170  Episodes of hypoglycemia? no   Current Insulin Regimen: N/A Most Recent Eye Exam: 4 months ago Weight trend: stable Prior visit with dietician: no Current diet: in general, a "healthy" diet   Current exercise: daily. Walks 2-3 miles per day.  Pertinent Labs:    Component Value Date/Time   CHOL 112 12/25/2015 0913   TRIG 100 12/25/2015 0913   HDL 37 (L) 12/25/2015 0913   LDLCALC 55 12/25/2015 0913   CREATININE 1.16 12/25/2015 0913    Wt Readings from Last 3 Encounters:  09/18/16 211 lb (95.7 kg)  06/28/16 212 lb (96.2 kg)  05/20/16 212 lb (96.2 kg)    ------------------------------------------------------------------------   No Known Allergies   Current Outpatient Prescriptions:  .  amLODipine (NORVASC) 5 MG tablet, TAKE 1 TABLET BY MOUTH EVERY DAY, Disp: 30 tablet, Rfl: 12 .  aspirin 81 MG EC tablet, Take 81 mg by mouth daily. Swallow whole., Disp: , Rfl:  .  atorvastatin (LIPITOR) 20 MG tablet, Take 1 tablet (20 mg total) by mouth daily., Disp: 90 tablet, Rfl: 3 .  benazepril (LOTENSIN) 40 MG tablet, TAKE 1 TABLET BY MOUTH EVERY DAY, Disp: 30 tablet, Rfl: 12 .  glucose blood test strip, Use as instructed, Disp: 100 each, Rfl: 12 .  latanoprost (XALATAN) 0.005 % ophthalmic solution, Place 1 drop into both eyes at bedtime.,  Disp: , Rfl:  .  metFORMIN (GLUCOPHAGE) 1000 MG tablet, TAKE 1 TABLET BY MOUTH TWICE DAILY, Disp: 60 tablet, Rfl: 12 .  pioglitazone (ACTOS) 30 MG tablet, TAKE 1 TABLET BY MOUTH EVERY DAY, Disp: 30 tablet, Rfl: 12  Review of Systems  Constitutional: Negative for activity change, appetite change, chills, diaphoresis, fatigue, fever and unexpected weight change.  Eyes: Negative.   Respiratory: Negative for shortness of breath.   Cardiovascular: Negative for chest pain, palpitations and leg swelling.  Endocrine: Negative for polydipsia and polyphagia.  Allergic/Immunologic: Negative.   Hematological: Negative.   Psychiatric/Behavioral: Negative.     Social History  Substance Use Topics  . Smoking status: Former Smoker    Years: 14.00    Types: Cigarettes    Quit date: 07/08/1973  . Smokeless tobacco: Never Used  . Alcohol use No   Objective:   BP 130/64 (BP Location: Right Arm, Patient Position: Sitting, Cuff Size: Large)   Pulse 80   Temp 98.2 F (36.8 C) (Oral)   Resp 16   Wt 211 lb (95.7 kg)   BMI 30.28 kg/m  Vitals:   09/18/16 1356  BP: 130/64  Pulse: 80  Resp: 16  Temp: 98.2 F (36.8 C)  TempSrc: Oral  Weight: 211 lb (95.7 kg)     Physical Exam  Constitutional: He is oriented to person, place, and time. He  appears well-developed and well-nourished.  HENT:  Head: Normocephalic and atraumatic.  Right Ear: External ear normal.  Left Ear: External ear normal.  Nose: Nose normal.  Eyes: Conjunctivae are normal.  Neck: Neck supple. No thyromegaly present.  Cardiovascular: Normal rate, regular rhythm and normal heart sounds.   Pulmonary/Chest: Effort normal and breath sounds normal.  Abdominal: Soft.  Neurological: He is alert and oriented to person, place, and time.  Skin: Skin is warm and dry.  Psychiatric: He has a normal mood and affect. His behavior is normal. Judgment and thought content normal.        Assessment & Plan:     1. Type 2 diabetes mellitus  without complication, without long-term current use of insulin (HCC)  - POCT glycosylated hemoglobin (Hb A1C) - pioglitazone (ACTOS) 45 MG tablet; Take 1 tablet (45 mg total) by mouth daily.  Dispense: 90 tablet; Refill: 3 2.HTN 3.HLD    Patient seen and examined by Miguel Aschoff, MD, and note scribed by Renaldo Fiddler, CMA. I technology has been used in this note in any air is in the dictation or transcription are unintentional. I have done the exam and reviewed the above chart and it is accurate to the best of my knowledge. Development worker, community has been used in this note in any air is in the dictation or transcription are unintentional.  Wilhemena Durie, MD  [place]

## 2016-10-07 ENCOUNTER — Other Ambulatory Visit: Payer: Self-pay | Admitting: Family Medicine

## 2016-10-24 ENCOUNTER — Ambulatory Visit (INDEPENDENT_AMBULATORY_CARE_PROVIDER_SITE_OTHER): Payer: PPO

## 2016-10-24 VITALS — BP 138/62 | HR 72 | Temp 99.2°F | Ht 70.0 in | Wt 213.4 lb

## 2016-10-24 DIAGNOSIS — Z0000 Encounter for general adult medical examination without abnormal findings: Secondary | ICD-10-CM

## 2016-10-24 NOTE — Patient Instructions (Signed)
Juan Mack , Thank you for taking time to come for your Medicare Wellness Visit. I appreciate your ongoing commitment to your health goals. Please review the following plan we discussed and let me know if I can assist you in the future.   Screening recommendations/referrals: Colonoscopy: done 03/21/16 Recommended yearly ophthalmology/optometry visit for glaucoma screening and checkup Recommended yearly dental visit for hygiene and checkup  Vaccinations: Influenza vaccine: done 03/2016 Pneumococcal vaccine: completed series Tdap vaccine: last done 02/13/15 Shingles vaccine: completed per patient   Advanced directives: declined  Next appointment: 12/30/16 @ 8:15 AM  Preventive Care 72 Years and Older, Male Preventive care refers to lifestyle choices and visits with your health care provider that can promote health and wellness. What does preventive care include?  A yearly physical exam. This is also called an annual well check.  Dental exams once or twice a year.  Routine eye exams. Ask your health care provider how often you should have your eyes checked.  Personal lifestyle choices, including:  Daily care of your teeth and gums.  Regular physical activity.  Eating a healthy diet.  Avoiding tobacco and drug use.  Limiting alcohol use.  Practicing safe sex.  Taking low doses of aspirin every day.  Taking vitamin and mineral supplements as recommended by your health care provider. What happens during an annual well check? The services and screenings done by your health care provider during your annual well check will depend on your age, overall health, lifestyle risk factors, and family history of disease. Counseling  Your health care provider may ask you questions about your:  Alcohol use.  Tobacco use.  Drug use.  Emotional well-being.  Home and relationship well-being.  Sexual activity.  Eating habits.  History of falls.  Memory and ability to understand  (cognition).  Work and work Statistician. Screening  You may have the following tests or measurements:  Height, weight, and BMI.  Blood pressure.  Lipid and cholesterol levels. These may be checked every 5 years, or more frequently if you are over 45 years old.  Skin check.  Lung cancer screening. You may have this screening every year starting at age 69 if you have a 30-pack-year history of smoking and currently smoke or have quit within the past 15 years.  Fecal occult blood test (FOBT) of the stool. You may have this test every year starting at age 35.  Flexible sigmoidoscopy or colonoscopy. You may have a sigmoidoscopy every 5 years or a colonoscopy every 10 years starting at age 63.  Prostate cancer screening. Recommendations will vary depending on your family history and other risks.  Hepatitis C blood test.  Hepatitis B blood test.  Sexually transmitted disease (STD) testing.  Diabetes screening. This is done by checking your blood sugar (glucose) after you have not eaten for a while (fasting). You may have this done every 1-3 years.  Abdominal aortic aneurysm (AAA) screening. You may need this if you are a current or former smoker.  Osteoporosis. You may be screened starting at age 32 if you are at high risk. Talk with your health care provider about your test results, treatment options, and if necessary, the need for more tests. Vaccines  Your health care provider may recommend certain vaccines, such as:  Influenza vaccine. This is recommended every year.  Tetanus, diphtheria, and acellular pertussis (Tdap, Td) vaccine. You may need a Td booster every 10 years.  Zoster vaccine. You may need this after age 5.  Pneumococcal 13-valent  conjugate (PCV13) vaccine. One dose is recommended after age 51.  Pneumococcal polysaccharide (PPSV23) vaccine. One dose is recommended after age 51. Talk to your health care provider about which screenings and vaccines you need and  how often you need them. This information is not intended to replace advice given to you by your health care provider. Make sure you discuss any questions you have with your health care provider. Document Released: 07/21/2015 Document Revised: 03/13/2016 Document Reviewed: 04/25/2015 Elsevier Interactive Patient Education  2017 [place] Prevention in the Home Falls can cause injuries. They can happen to people of all ages. There are many things you can do to make your home safe and to help prevent falls. What can I do on the outside of my home?  Regularly fix the edges of walkways and driveways and fix any cracks.  Remove anything that might make you trip as you walk through a door, such as a raised step or threshold.  Trim any bushes or trees on the path to your home.  Use bright outdoor lighting.  Clear any walking paths of anything that might make someone trip, such as rocks or tools.  Regularly check to see if handrails are loose or broken. Make sure that both sides of any steps have handrails.  Any raised decks and porches should have guardrails on the edges.  Have any leaves, snow, or ice cleared regularly.  Use sand or salt on walking paths during winter.  Clean up any spills in your garage right away. This includes oil or grease spills. What can I do in the bathroom?  Use night lights.  Install grab bars by the toilet and in the tub and shower. Do not use towel bars as grab bars.  Use non-skid mats or decals in the tub or shower.  If you need to sit down in the shower, use a plastic, non-slip stool.  Keep the floor dry. Clean up any water that spills on the floor as soon as it happens.  Remove soap buildup in the tub or shower regularly.  Attach bath mats securely with double-sided non-slip rug tape.  Do not have throw rugs and other things on the floor that can make you trip. What can I do in the bedroom?  Use night lights.  Make sure that you  have a light by your bed that is easy to reach.  Do not use any sheets or blankets that are too big for your bed. They should not hang down onto the floor.  Have a firm chair that has side arms. You can use this for support while you get dressed.  Do not have throw rugs and other things on the floor that can make you trip. What can I do in the kitchen?  Clean up any spills right away.  Avoid walking on wet floors.  Keep items that you use a lot in easy-to-reach places.  If you need to reach something above you, use a strong step stool that has a grab bar.  Keep electrical cords out of the way.  Do not use floor polish or wax that makes floors slippery. If you must use wax, use non-skid floor wax.  Do not have throw rugs and other things on the floor that can make you trip. What can I do with my stairs?  Do not leave any items on the stairs.  Make sure that there are handrails on both sides of the stairs and use them. Fix handrails  that are broken or loose. Make sure that handrails are as long as the stairways.  Check any carpeting to make sure that it is firmly attached to the stairs. Fix any carpet that is loose or worn.  Avoid having throw rugs at the top or bottom of the stairs. If you do have throw rugs, attach them to the floor with carpet tape.  Make sure that you have a light switch at the top of the stairs and the bottom of the stairs. If you do not have them, ask someone to add them for you. What else can I do to help prevent falls?  Wear shoes that:  Do not have high heels.  Have rubber bottoms.  Are comfortable and fit you well.  Are closed at the toe. Do not wear sandals.  If you use a stepladder:  Make sure that it is fully opened. Do not climb a closed stepladder.  Make sure that both sides of the stepladder are locked into place.  Ask someone to hold it for you, if possible.  Clearly mark and make sure that you can see:  Any grab bars or  handrails.  First and last steps.  Where the edge of each step is.  Use tools that help you move around (mobility aids) if they are needed. These include:  Canes.  Walkers.  Scooters.  Crutches.  Turn on the lights when you go into a dark area. Replace any light bulbs as soon as they burn out.  Set up your furniture so you have a clear path. Avoid moving your furniture around.  If any of your floors are uneven, fix them.  If there are any pets around you, be aware of where they are.  Review your medicines with your doctor. Some medicines can make you feel dizzy. This can increase your chance of falling. Ask your doctor what other things that you can do to help prevent falls. This information is not intended to replace advice given to you by your health care provider. Make sure you discuss any questions you have with your health care provider. Document Released: 04/20/2009 Document Revised: 11/30/2015 Document Reviewed: 07/29/2014 Elsevier Interactive Patient Education  2017 [employer].

## 2016-10-24 NOTE — Progress Notes (Signed)
Subjective:   Juan Mack is a 71 y.o. male who presents for Medicare Annual/Subsequent preventive examination.  Review of Systems:  N/A  Cardiac Risk Factors include: advanced age (>45men, >40 women);diabetes mellitus;dyslipidemia;hypertension;male gender;obesity (BMI >30kg/m2)     Objective:    Vitals: BP 138/62 (BP Location: Right Arm)   Pulse 72   Temp 99.2 F (37.3 C) (Oral)   Ht 5\' 10"  (1.778 m)   Wt 213 lb 6.4 oz (96.8 kg)   BMI 30.62 kg/m   Body mass index is 30.62 kg/m.  Tobacco History  Smoking Status  . Former Smoker  . Years: 14.00  . Types: Cigarettes  . Quit date: 07/08/1973  Smokeless Tobacco  . Never Used     Counseling given: Not Answered   Past Medical History:  Diagnosis Date  . Anxiety   . Anxiety disorder   . BCC (basal cell carcinoma)   . Central vein occlusion of retina   . Dermatitis   . Diabetes mellitus, type II ([place])   . Elevated PSA   . Hyperlipemia   . Hyperlipidemia   . Hypertension   . Insomnia   . Insomnia   . OSA on CPAP   . Prostate nodule   . Pruritus   . Stable central retinal vein occlusion    Past Surgical History:  Procedure Laterality Date  . COLONOSCOPY WITH PROPOFOL N/A 03/21/2016   Procedure: COLONOSCOPY WITH PROPOFOL;  Surgeon: Manya Silvas, MD;  Location: [facility] END[facility];  Service: Endoscopy;  Laterality: N/A;  . NO PAST SURGERIES     Family History  Problem Relation Age of Onset  . Prostate cancer Brother     Father  . Diabetes Brother   . Colon cancer Brother   . Pancreatic cancer Sister   . Lung cancer Sister   . Coronary artery disease Father   . Heart disease Father   . Congestive Heart Failure Mother   . Diabetes Mother    History  Sexual Activity  . Sexual activity: Not on file    Outpatient Encounter Prescriptions as of 10/24/2016  Medication Sig  . amLODipine (NORVASC) 5 MG tablet TAKE 1 TABLET BY MOUTH EVERY DAY  . aspirin 81 MG EC tablet Take 81 mg by mouth daily. Swallow  whole.  Marland Kitchen atorvastatin (LIPITOR) 20 MG tablet Take 1 tablet (20 mg total) by mouth daily.  . benazepril (LOTENSIN) 40 MG tablet TAKE 1 TABLET BY MOUTH EVERY DAY  . glucose blood test strip Use as instructed  . latanoprost (XALATAN) 0.005 % ophthalmic solution Place 1 drop into both eyes at bedtime.  . metFORMIN (GLUCOPHAGE) 1000 MG tablet TAKE 1 TABLET BY MOUTH TWICE DAILY  . pioglitazone (ACTOS) 45 MG tablet Take 1 tablet (45 mg total) by mouth daily.   No facility-administered encounter medications on file as of 10/24/2016.     Activities of Daily Living In your present state of health, do you have any difficulty performing the following activities: 10/24/2016  Hearing? N  Vision? N  Difficulty concentrating or making decisions? N  Walking or climbing stairs? N  Dressing or bathing? N  Doing errands, shopping? N  Preparing Food and eating ? N  Using the Toilet? N  In the past six months, have you accidently leaked urine? N  Do you have problems with loss of bowel control? N  Managing your Medications? N  Managing your Finances? N  Housekeeping or managing your Housekeeping? N  Some recent data might be  hidden    Patient Care Team: Jerrol Banana., MD as PCP - General (Family Medicine) Ronnell Freshwater, MD as Referring Physician (Ophthalmology) Manya Silvas, MD as Consulting Physician (Gastroenterology) Hollice Espy, MD as Consulting Physician (Urology)   Assessment:     Exercise Activities and Dietary recommendations Current Exercise Habits: Home exercise routine, Type of exercise: walking, Time (Minutes): > 60, Frequency (Times/Week): 7, Weekly Exercise (Minutes/Week): 0, Intensity: Mild, Exercise limited by: None identified  Goals    . Increase water intake          Recommend increasing water intake to 4 glasses a day.      Fall Risk Fall Risk  10/24/2016 10/24/2015 06/22/2015  Falls in the past year? No No No   Depression Screen PHQ 2/9 Scores  10/24/2016 10/24/2016 10/24/2015 06/22/2015  PHQ - 2 Score 0 0 0 0  PHQ- 9 Score 0 - - -    Cognitive Function     6CIT Screen 10/24/2016  What Year? 0 points  What month? 0 points  What time? 0 points  Count back from 20 0 points  Months in reverse 0 points  Repeat phrase 2 points  Total Score 2    Immunization History  Administered Date(s) Administered  . Pneumococcal Conjugate-13 12/14/2013  . Pneumococcal Polysaccharide-23 02/28/2011  . Td 07/27/2003, 02/13/2015   Screening Tests Health Maintenance  Topic Date Due  . OPHTHALMOLOGY EXAM  10/30/2016  . INFLUENZA VACCINE  02/05/2017  . HEMOGLOBIN A1C  03/21/2017  . FOOT EXAM  05/20/2017  . TETANUS/TDAP  02/12/2025  . COLONOSCOPY  03/21/2026  . Hepatitis C Screening  Completed  . PNA vac Low Risk Adult  Completed      Plan:  I have personally reviewed and addressed the Medicare Annual Wellness questionnaire and have noted the following in the patient's chart:  A. Medical and social history B. Use of alcohol, tobacco or illicit drugs  C. Current medications and supplements D. Functional ability and status E.  Nutritional status F.  Physical activity G. Advance directives H. List of other physicians I.  Hospitalizations, surgeries, and ER visits in previous 12 months J.  [place] such as hearing and vision if needed, cognitive and depression L. Referrals and appointments - none  In addition, I have reviewed and discussed with patient certain preventive protocols, quality metrics, and best practice recommendations. A written personalized care plan for preventive services as well as general preventive health recommendations were provided to patient.  See attached scanned questionnaire for additional information.   Signed,  Fabio Neighbors, LPN Nurse Health Advisor   MD Recommendations: None I have reviewed the health advisors note, was  available for consultation and I agree with documentation and  plan. Miguel Aschoff MD [facility]

## 2016-11-12 ENCOUNTER — Encounter: Payer: Self-pay | Admitting: *Deleted

## 2016-11-13 ENCOUNTER — Ambulatory Visit: Payer: PPO | Admitting: Anesthesiology

## 2016-11-13 ENCOUNTER — Encounter: Payer: Self-pay | Admitting: *Deleted

## 2016-11-13 ENCOUNTER — Encounter
Admission: RE | Disposition: A | Discharge status: Home / Self Care | Payer: Self-pay | Source: Ambulatory Visit | Attending: Unknown Physician Specialty

## 2016-11-13 ENCOUNTER — Ambulatory Visit
Admission: RE | Admit: 2016-11-13 | Discharge: 2016-11-13 | Disposition: A | Discharge status: Home / Self Care | Payer: PPO | Source: Ambulatory Visit | Attending: Unknown Physician Specialty | Admitting: Unknown Physician Specialty

## 2016-11-13 DIAGNOSIS — L299 Pruritus, unspecified: Secondary | ICD-10-CM | POA: Diagnosis not present

## 2016-11-13 DIAGNOSIS — Z7982 Long term (current) use of aspirin: Secondary | ICD-10-CM | POA: Insufficient documentation

## 2016-11-13 DIAGNOSIS — Z85828 Personal history of other malignant neoplasm of skin: Secondary | ICD-10-CM | POA: Diagnosis not present

## 2016-11-13 DIAGNOSIS — G4700 Insomnia, unspecified: Secondary | ICD-10-CM | POA: Insufficient documentation

## 2016-11-13 DIAGNOSIS — G4733 Obstructive sleep apnea (adult) (pediatric): Secondary | ICD-10-CM | POA: Insufficient documentation

## 2016-11-13 DIAGNOSIS — E785 Hyperlipidemia, unspecified: Secondary | ICD-10-CM | POA: Diagnosis not present

## 2016-11-13 DIAGNOSIS — Z87891 Personal history of nicotine dependence: Secondary | ICD-10-CM | POA: Insufficient documentation

## 2016-11-13 DIAGNOSIS — K5730 Diverticulosis of large intestine without perforation or abscess without bleeding: Secondary | ICD-10-CM | POA: Diagnosis not present

## 2016-11-13 DIAGNOSIS — I10 Essential (primary) hypertension: Secondary | ICD-10-CM | POA: Insufficient documentation

## 2016-11-13 DIAGNOSIS — Z86010 Personal history of colonic polyps: Secondary | ICD-10-CM | POA: Diagnosis not present

## 2016-11-13 DIAGNOSIS — I739 Peripheral vascular disease, unspecified: Secondary | ICD-10-CM | POA: Diagnosis not present

## 2016-11-13 DIAGNOSIS — E119 Type 2 diabetes mellitus without complications: Secondary | ICD-10-CM | POA: Insufficient documentation

## 2016-11-13 DIAGNOSIS — Z7984 Long term (current) use of oral hypoglycemic drugs: Secondary | ICD-10-CM | POA: Insufficient documentation

## 2016-11-13 DIAGNOSIS — K5790 Diverticulosis of intestine, part unspecified, without perforation or abscess without bleeding: Secondary | ICD-10-CM | POA: Diagnosis not present

## 2016-11-13 DIAGNOSIS — Z1211 Encounter for screening for malignant neoplasm of colon: Secondary | ICD-10-CM | POA: Insufficient documentation

## 2016-11-13 DIAGNOSIS — Z79899 Other long term (current) drug therapy: Secondary | ICD-10-CM | POA: Diagnosis not present

## 2016-11-13 DIAGNOSIS — F419 Anxiety disorder, unspecified: Secondary | ICD-10-CM | POA: Diagnosis not present

## 2016-11-13 DIAGNOSIS — K640 First degree hemorrhoids: Secondary | ICD-10-CM | POA: Insufficient documentation

## 2016-11-13 DIAGNOSIS — Z800 Family history of malignant neoplasm of digestive organs: Secondary | ICD-10-CM | POA: Diagnosis not present

## 2016-11-13 DIAGNOSIS — K648 Other hemorrhoids: Secondary | ICD-10-CM | POA: Diagnosis not present

## 2016-11-13 HISTORY — PX: COLONOSCOPY WITH PROPOFOL: SHX5780

## 2016-11-13 LAB — GLUCOSE, CAPILLARY: GLUCOSE-CAPILLARY: 172 mg/dL — AB (ref 65–99)

## 2016-11-13 SURGERY — COLONOSCOPY WITH PROPOFOL
Anesthesia: General

## 2016-11-13 MED ORDER — SODIUM CHLORIDE 0.9 % IV SOLN
INTRAVENOUS | Status: DC | PRN
  Administered 2016-11-13: 08:00:00 via INTRAVENOUS

## 2016-11-13 MED ORDER — SODIUM CHLORIDE 0.9 % IV SOLN: INTRAVENOUS | Status: DC

## 2016-11-13 MED ORDER — PROPOFOL 500 MG/50ML IV EMUL
INTRAVENOUS | Status: AC
  Filled 2016-11-13: qty 50

## 2016-11-13 MED ORDER — FENTANYL CITRATE (PF) 100 MCG/2ML IJ SOLN
INTRAMUSCULAR | Status: AC
  Filled 2016-11-13: qty 2

## 2016-11-13 MED ORDER — PROPOFOL 500 MG/50ML IV EMUL
INTRAVENOUS | Status: DC | PRN
  Administered 2016-11-13: 120 ug/kg/min via INTRAVENOUS

## 2016-11-13 MED ORDER — MIDAZOLAM HCL 2 MG/2ML IJ SOLN
INTRAMUSCULAR | Status: AC
  Filled 2016-11-13: qty 2

## 2016-11-13 MED ORDER — MIDAZOLAM HCL 2 MG/2ML IJ SOLN
INTRAMUSCULAR | Status: DC | PRN
  Administered 2016-11-13: 2 mg via INTRAVENOUS

## 2016-11-13 MED ORDER — FENTANYL CITRATE (PF) 100 MCG/2ML IJ SOLN
INTRAMUSCULAR | Status: DC | PRN
  Administered 2016-11-13: 50 ug via INTRAVENOUS

## 2016-11-13 NOTE — Op Note (Signed)
[facility] Gastroenterology Patient Name: Juan Mack Procedure Date: 11/13/2016 7:38 AM MRN: [id number] Account #: [id number] Date of Birth: [birth year] Admit Type: Outpatient Age: 71 Room: [facility] ENDO ROOM 3 Gender: Male Note Status: Finalized Procedure:            Colonoscopy Indications:          High risk colon cancer surveillance: Personal history                        of colonic polyps Providers:            Manya Silvas, MD Referring MD:         Janine Ores. Rosanna Randy, MD (Referring MD) Medicines:            Propofol per Anesthesia Complications:        No immediate complications. Procedure:            Pre-Anesthesia Assessment:                       - After reviewing the risks and benefits, the patient                        was deemed in satisfactory condition to undergo the                        procedure.                       After obtaining informed consent, the colonoscope was                        passed under direct vision. Throughout the procedure,                        the patient's blood pressure, pulse, and oxygen                        saturations were monitored continuously. The                        Colonoscope was introduced through the anus and                        advanced to the the cecum, identified by appendiceal                        orifice and ileocecal valve. The colonoscopy was                        somewhat difficult due to a tortuous colon. Successful                        completion of the procedure was aided by applying                        abdominal pressure. The patient tolerated the procedure                        well. The quality of the bowel preparation was  excellent. Findings:      Multiple small-mouthed diverticula were found in the sigmoid colon,       descending colon and transverse colon.      Internal hemorrhoids were found during endoscopy. The hemorrhoids were       small and  Grade I (internal hemorrhoids that do not prolapse).      The exam was otherwise without abnormality. No remnant of polyp tissue       seen. Impression:           - Diverticulosis in the sigmoid colon, in the                        descending colon and in the transverse colon.                       - Internal hemorrhoids.                       - The examination was otherwise normal.                       - No specimens collected. Recommendation:       - Repeat colonoscopy in 5 years for surveillance. Manya Silvas, MD 11/13/2016 8:08:44 AM This report has been signed electronically. Number of Addenda: 0 Note Initiated On: 11/13/2016 7:38 AM Scope Withdrawal Time: 0 hours 6 minutes 14 seconds  Total Procedure Duration: 0 hours 23 minutes 22 seconds       [facility]

## 2016-11-13 NOTE — Anesthesia Postprocedure Evaluation (Signed)
Anesthesia Post Note  Patient: Juan Mack  Procedure(s) Performed: Procedure(s) (LRB): COLONOSCOPY WITH PROPOFOL (N/A)  Patient location during evaluation: PACU Anesthesia Type: General Level of consciousness: awake Pain management: pain level controlled Vital Signs Assessment: post-procedure vital signs reviewed and stable Respiratory status: nonlabored ventilation Cardiovascular status: stable Anesthetic complications: no     Last Vitals:  Vitals:   11/13/16 0828 11/13/16 0848  BP: 122/64 126/66  Pulse: 70 67  Resp: 13 14  Temp:      Last Pain:  Vitals:   11/13/16 0808  TempSrc: Tympanic                 VAN STAVEREN,Danyel Tobey

## 2016-11-13 NOTE — Transfer of Care (Signed)
Immediate Anesthesia Transfer of Care Note  Patient: Juan Mack  Procedure(s) Performed: Procedure(s): COLONOSCOPY WITH PROPOFOL (N/A)  Patient Location: PACU  Anesthesia Type:General  Level of Consciousness: awake and sedated  Airway & Oxygen Therapy: Patient Spontanous Breathing and Patient connected to face mask oxygen  Post-op Assessment: Report given to RN and Post -op Vital signs reviewed and stable  Post vital signs: Reviewed and stable  Last Vitals:  Vitals:   11/13/16 0658  BP: (!) 168/71  Pulse: 74  Resp: 16  Temp: 36.2 C    Last Pain:  Vitals:   11/13/16 0658  TempSrc: Tympanic         Complications: No apparent anesthesia complications

## 2016-11-13 NOTE — Anesthesia Post-op Follow-up Note (Cosign Needed)
Anesthesia QCDR form completed.        

## 2016-11-13 NOTE — Anesthesia Preprocedure Evaluation (Signed)
Anesthesia Evaluation  Patient identified by MRN, date of birth, ID band Patient awake    Reviewed: Allergy & Precautions  Airway Mallampati: II       Dental  (+) Teeth Intact, Caps   Pulmonary sleep apnea , former smoker,     + decreased breath sounds      Cardiovascular Exercise Tolerance: Good hypertension, Pt. on medications + Peripheral Vascular Disease   Rhythm:Regular     Neuro/Psych negative neurological ROS     GI/Hepatic negative GI ROS, Neg liver ROS,   Endo/Other  diabetes, Type 2  Renal/GU negative Renal ROS     Musculoskeletal negative musculoskeletal ROS (+)   Abdominal   Peds  Hematology negative hematology ROS (+)   Anesthesia Other Findings   Reproductive/Obstetrics                             Anesthesia Physical Anesthesia Plan  ASA: III  Anesthesia Plan: General   Post-op Pain Management:    Induction: Intravenous  Airway Management Planned: Natural Airway and Nasal Cannula  Additional Equipment:   Intra-op Plan:   Post-operative Plan:   Informed Consent: I have reviewed the patients History and Physical, chart, labs and discussed the procedure including the risks, benefits and alternatives for the proposed anesthesia with the patient or authorized representative who has indicated his/her understanding and acceptance.     Plan Discussed with: CRNA  Anesthesia Plan Comments:         Anesthesia Quick Evaluation

## 2016-11-13 NOTE — Anesthesia Procedure Notes (Signed)
Performed by: COOK-MARTIN, Herma Uballe Pre-anesthesia Checklist: Patient identified, Emergency Drugs available, Suction available, Patient being monitored and Timeout performed Patient Re-evaluated:Patient Re-evaluated prior to inductionOxygen Delivery Method: Simple face mask Preoxygenation: Pre-oxygenation with 100% oxygen Intubation Type: IV induction Placement Confirmation: positive ETCO2 and CO2 detector       

## 2016-11-13 NOTE — H&P (Signed)
Primary Care Physician:  Jerrol Banana., MD Primary Gastroenterologist:  Dr. Vira Agar  Pre-Procedure History & Physical: HPI:  Juan Mack is a 71 y.o. male is here for an colonoscopy.   Past Medical History:  Diagnosis Date  . Anxiety   . Anxiety disorder   . BCC (basal cell carcinoma)   . Central vein occlusion of retina   . Dermatitis   . Diabetes mellitus, type II ([place])   . Elevated PSA   . Hyperlipemia   . Hyperlipidemia   . Hypertension   . Insomnia   . Insomnia   . OSA on CPAP   . Prostate nodule   . Pruritus   . Stable central retinal vein occlusion     Past Surgical History:  Procedure Laterality Date  . COLONOSCOPY WITH PROPOFOL N/A 03/21/2016   Procedure: COLONOSCOPY WITH PROPOFOL;  Surgeon: Manya Silvas, MD;  Location: [facility] ENDOSCOPY;  Service: Endoscopy;  Laterality: N/A;  . NO PAST SURGERIES      Prior to Admission medications   Medication Sig Start Date End Date Taking? Authorizing Provider  amLODipine (NORVASC) 5 MG tablet TAKE 1 TABLET BY MOUTH EVERY DAY 04/23/16  Yes Jerrol Banana., MD  aspirin 81 MG EC tablet Take 81 mg by mouth daily. Swallow whole.   Yes [provider]  atorvastatin (LIPITOR) 20 MG tablet Take 1 tablet (20 mg total) by mouth daily. 12/19/15  Yes Jerrol Banana., MD  benazepril (LOTENSIN) 40 MG tablet TAKE 1 TABLET BY MOUTH EVERY DAY 04/23/16  Yes Jerrol Banana., MD  latanoprost (XALATAN) 0.005 % ophthalmic solution Place 1 drop into both eyes at bedtime.   Yes [provider]  metFORMIN (GLUCOPHAGE) 1000 MG tablet TAKE 1 TABLET BY MOUTH TWICE DAILY 11/11/15  Yes Jerrol Banana., MD  pioglitazone (ACTOS) 45 MG tablet Take 1 tablet (45 mg total) by mouth daily. Patient taking differently: Take 30 mg by mouth daily.  09/18/16  Yes Jerrol Banana., MD  glucose blood test strip Use as instructed 10/24/15   Jerrol Banana., MD    Allergies as of 09/16/2016  . (No  Known Allergies)    Family History  Problem Relation Age of Onset  . Prostate cancer Brother     Father  . Diabetes Brother   . Colon cancer Brother   . Pancreatic cancer Sister   . Lung cancer Sister   . Coronary artery disease Father   . Heart disease Father   . Congestive Heart Failure Mother   . Diabetes Mother     Social History   Social History  . Marital status: Married    Spouse name: N/A  . Number of children: N/A  . Years of education: N/A   Occupational History  . Not on file.   Social History Main Topics  . Smoking status: Former Smoker    Years: 14.00    Types: Cigarettes    Quit date: 07/08/1973  . Smokeless tobacco: Never Used  . Alcohol use No  . Drug use: No  . Sexual activity: Not on file   Other Topics Concern  . Not on file   Social History Narrative  . No narrative on file    Review of Systems: See HPI, otherwise negative ROS  Physical Exam: BP (!) 168/71   Pulse 74   Temp 97.1 F (36.2 C) (Tympanic)   Resp 16   Wt 93 kg (  205 lb)   SpO2 100%   BMI 29.41 kg/m  General:   Alert,  pleasant and cooperative in NAD Head:  Normocephalic and atraumatic. Neck:  Supple; no masses or thyromegaly. Lungs:  Clear throughout to auscultation.    Heart:  Regular rate and rhythm. Abdomen:  Soft, nontender and nondistended. Normal bowel sounds, without guarding, and without rebound.   Neurologic:  Alert and  oriented x4;  grossly normal neurologically.  Impression/Plan: Juan Mack is here for an colonoscopy to be performed for follow up polyps.  Risks, benefits, limitations, and alternatives regarding  colonoscopy have been reviewed with the patient.  Questions have been answered.  All parties agreeable.   Gaylyn Cheers, MD  11/13/2016, 7:28 AM

## 2016-11-15 ENCOUNTER — Encounter: Payer: Self-pay | Admitting: Unknown Physician Specialty

## 2016-11-20 DIAGNOSIS — H401131 Primary open-angle glaucoma, bilateral, mild stage: Secondary | ICD-10-CM | POA: Diagnosis not present

## 2016-11-20 LAB — HM DIABETES EYE EXAM

## 2016-12-05 ENCOUNTER — Other Ambulatory Visit: Payer: Self-pay

## 2016-12-05 DIAGNOSIS — C61 Malignant neoplasm of prostate: Secondary | ICD-10-CM

## 2016-12-10 ENCOUNTER — Other Ambulatory Visit: Payer: Self-pay | Admitting: Family Medicine

## 2016-12-21 ENCOUNTER — Other Ambulatory Visit: Payer: Self-pay | Admitting: Family Medicine

## 2016-12-24 ENCOUNTER — Other Ambulatory Visit: Payer: PPO

## 2016-12-24 DIAGNOSIS — C61 Malignant neoplasm of prostate: Secondary | ICD-10-CM | POA: Diagnosis not present

## 2016-12-25 LAB — PSA: Prostate Specific Ag, Serum: 2.2 ng/mL (ref 0.0–4.0)

## 2016-12-27 ENCOUNTER — Ambulatory Visit: Payer: PPO | Admitting: Urology

## 2016-12-30 ENCOUNTER — Encounter: Payer: Self-pay | Admitting: Family Medicine

## 2016-12-30 ENCOUNTER — Ambulatory Visit (INDEPENDENT_AMBULATORY_CARE_PROVIDER_SITE_OTHER): Payer: PPO | Admitting: Family Medicine

## 2016-12-30 VITALS — BP 138/60 | HR 64 | Temp 97.5°F | Resp 16 | Wt 209.0 lb

## 2016-12-30 DIAGNOSIS — E119 Type 2 diabetes mellitus without complications: Secondary | ICD-10-CM

## 2016-12-30 DIAGNOSIS — I10 Essential (primary) hypertension: Secondary | ICD-10-CM | POA: Diagnosis not present

## 2016-12-30 DIAGNOSIS — E7800 Pure hypercholesterolemia, unspecified: Secondary | ICD-10-CM | POA: Diagnosis not present

## 2016-12-30 LAB — POCT GLYCOSYLATED HEMOGLOBIN (HGB A1C): HEMOGLOBIN A1C: 7.8

## 2016-12-30 NOTE — Progress Notes (Signed)
Subjective:  HPI  Diabetes Mellitus Type II, Follow-up:   Lab Results  Component Value Date   HGBA1C 9.0 09/18/2016   HGBA1C 8.2 05/20/2016   HGBA1C 8.5 (H) 12/25/2015    Last seen for diabetes 3 months ago.  Management since then includes increasing Actos to 45 mg daily. He reports good compliance with treatment. He is not having side effects.  Current symptoms include none and have been unchanged. Home blood sugar records: 160-170 fasting  Episodes of hypoglycemia? no   Current Insulin Regimen: n/a Most Recent Eye Exam: 3 months ago Current exercise: walking 2-3 miles a day.   Pertinent Labs:    Component Value Date/Time   CHOL 112 12/25/2015 0913   TRIG 100 12/25/2015 0913   HDL 37 (L) 12/25/2015 0913   LDLCALC 55 12/25/2015 0913   CREATININE 1.16 12/25/2015 0913    Wt Readings from Last 3 Encounters:  12/30/16 209 lb (94.8 kg)  11/13/16 205 lb (93 kg)  10/24/16 213 lb 6.4 oz (96.8 kg)   Overall patient feels well. ------------------------------------------------------------------------    Prior to Admission medications   Medication Sig Start Date End Date Taking? Authorizing Provider  amLODipine (NORVASC) 5 MG tablet TAKE 1 TABLET BY MOUTH EVERY DAY 04/23/16   Jerrol Banana., MD  aspirin 81 MG EC tablet Take 81 mg by mouth daily. Swallow whole.    [provider]  atorvastatin (LIPITOR) 20 MG tablet TAKE 1 TABLET(20 MG) BY MOUTH DAILY 12/21/16   Jerrol Banana., MD  benazepril (LOTENSIN) 40 MG tablet TAKE 1 TABLET BY MOUTH EVERY DAY 04/23/16   Jerrol Banana., MD  glucose blood test strip Use as instructed 10/24/15   Jerrol Banana., MD  latanoprost (XALATAN) 0.005 % ophthalmic solution Place 1 drop into both eyes at bedtime.    [provider]  metFORMIN (GLUCOPHAGE) 1000 MG tablet TAKE 1 TABLET BY MOUTH TWICE DAILY 12/10/16   Jerrol Banana., MD  pioglitazone (ACTOS) 45 MG tablet Take 1 tablet (45 mg  total) by mouth daily. Patient taking differently: Take 30 mg by mouth daily.  09/18/16   Jerrol Banana., MD    Patient Active Problem List   Diagnosis Date Noted  . Basal cell carcinoma of skin 12/01/2014  . 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 Special screening for malignant neoplasms, colon 12/01/2014  . Dermatitis, eczematoid 12/01/2014  . Polypharmacy 12/01/2014  . Essential (primary) hypertension 12/01/2014  . Anxiety, generalized 12/01/2014  . Flu vaccine need 12/01/2014  . HLD (hyperlipidemia) 12/01/2014  . Cannot sleep 12/01/2014  . Pruritic disorder 12/01/2014  . Obstructive apnea 12/01/2014  . Special screening for malignant neoplasm of prostate 12/01/2014  . Prostate lump 12/01/2014  . Scabies 12/01/2014  . Diabetes mellitus, type 2 ([place]) 12/01/2014    Past Medical History:  Diagnosis Date  . Anxiety   . Anxiety disorder   . BCC (basal cell carcinoma)   . Central vein occlusion of retina   . Dermatitis   . Diabetes mellitus, type II ([place])   . Elevated PSA   . Hyperlipemia   . Hyperlipidemia   . Hypertension   . Insomnia   . Insomnia   . OSA on CPAP   . Prostate nodule   . Pruritus   . Stable central retinal vein occlusion     Social History   Social History  . Marital status: Married    Spouse name:  N/A  . Number of children: N/A  . Years of education: N/A   Occupational History  . Not on file.   Social History Main Topics  . Smoking status: Former Smoker    Years: 14.00    Types: Cigarettes    Quit date: 07/08/1973  . Smokeless tobacco: Never Used  . Alcohol use No  . Drug use: No  . Sexual activity: Not on file   Other Topics Concern  . Not on file   Social History Narrative  . No narrative on file    No Known Allergies  Review of Systems  Constitutional: Negative.   HENT: Negative.   Eyes: Negative.   Respiratory: Negative.   Cardiovascular: Negative.   Gastrointestinal: Negative.   Genitourinary: Negative.    Musculoskeletal: Negative.   Skin: Negative.   Neurological: Negative.   Endo/Heme/Allergies: Negative.   Psychiatric/Behavioral: Negative.     Immunization History  Administered Date(s) Administered  . Pneumococcal Conjugate-13 12/14/2013  . Pneumococcal Polysaccharide-23 02/28/2011  . Td 07/27/2003, 02/13/2015    Objective:  BP 138/60 (BP Location: Left Arm, Patient Position: Sitting, Cuff Size: Normal)   Pulse 64   Temp 97.5 F (36.4 C) (Oral)   Resp 16   Wt 209 lb (94.8 kg)   BMI 29.99 kg/m   Physical Exam  Constitutional: He is oriented to person, place, and time and well-developed, well-nourished, and in no distress.  HENT:  Head: Normocephalic and atraumatic.  Right Ear: External ear normal.  Left Ear: External ear normal.  Nose: Nose normal.  Eyes: Conjunctivae and EOM are normal. Pupils are equal, round, and reactive to light. No scleral icterus.  Neck: Normal range of motion. Neck supple. No thyromegaly present.  Cardiovascular: Normal rate, regular rhythm, normal heart sounds and intact distal pulses.   Pulmonary/Chest: Effort normal and breath sounds normal.  Abdominal: Soft.  Musculoskeletal: Normal range of motion.  Neurological: He is alert and oriented to person, place, and time. He has normal reflexes. Gait normal. GCS score is 15.  Skin: Skin is warm and dry.  Psychiatric: Mood, memory, affect and judgment normal.   Diabetic Foot Exam - Simple   Simple Foot Form Diabetic Foot exam was performed with the following findings:  Yes 12/30/2016  8:39 AM  Visual Inspection No deformities, no ulcerations, no other skin breakdown bilaterally:  Yes Sensation Testing Intact to touch and monofilament testing bilaterally:  Yes Pulse Check Posterior Tibialis and Dorsalis pulse intact bilaterally:  Yes Comments      Lab Results  Component Value Date   WBC 8.1 12/25/2015   HGB 13.4 12/25/2015   HCT 40.4 12/25/2015   PLT 249 12/25/2015   GLUCOSE 209 (H)  12/25/2015   CHOL 112 12/25/2015   TRIG 100 12/25/2015   HDL 37 (L) 12/25/2015   LDLCALC 55 12/25/2015   TSH 2.080 12/25/2015   HGBA1C 9.0 09/18/2016    CMP     Component Value Date/Time   NA 140 12/25/2015 0913   K 5.1 12/25/2015 0913   CL 102 12/25/2015 0913   CO2 22 12/25/2015 0913   GLUCOSE 209 (H) 12/25/2015 0913   BUN 16 12/25/2015 0913   CREATININE 1.16 12/25/2015 0913   CALCIUM 9.9 12/25/2015 0913   PROT 7.3 12/25/2015 0913   ALBUMIN 4.4 12/25/2015 0913   AST 13 12/25/2015 0913   ALT 13 12/25/2015 0913   ALKPHOS 58 12/25/2015 0913   BILITOT 0.8 12/25/2015 0913   GFRNONAA 63 12/25/2015 0913  GFRAA 73 12/25/2015 0913    Assessment and Plan :  1. Type 2 diabetes mellitus without complication, without long-term current use of insulin (HCC)  - POCT HgB A1C 7.8 today. Improved. Continue current medications and diet and exercise.   2. Essential (primary) hypertension stable - CBC with Differential/Platelet - TSH  3. Pure hypercholesterolemia Stable.  - Lipid Panel With LDL/HDL Ratio - Comprehensive metabolic panel   HPI, Exam, and A&P Transcribed under the direction and in the presence of Richard L. Cranford Mon, MD  Electronically Signed: Katina Dung, CMA I have done the exam and reviewed the above chart and it is accurate to the best of my knowledge. Development worker, community has been used in this note in any air is in the dictation or transcription are unintentional.  [facility] 12/30/2016 8:15 AM

## 2016-12-31 LAB — CBC WITH DIFFERENTIAL/PLATELET
BASOS: 0 %
Basophils Absolute: 0 10*3/uL (ref 0.0–0.2)
EOS (ABSOLUTE): 0.1 10*3/uL (ref 0.0–0.4)
EOS: 2 %
HEMATOCRIT: 38.3 % (ref 37.5–51.0)
Hemoglobin: 12.6 g/dL — ABNORMAL LOW (ref 13.0–17.7)
IMMATURE GRANS (ABS): 0 10*3/uL (ref 0.0–0.1)
Immature Granulocytes: 0 %
LYMPHS: 36 %
Lymphocytes Absolute: 3.1 10*3/uL (ref 0.7–3.1)
MCH: 30.6 pg (ref 26.6–33.0)
MCHC: 32.9 g/dL (ref 31.5–35.7)
MCV: 93 fL (ref 79–97)
Monocytes Absolute: 0.6 10*3/uL (ref 0.1–0.9)
Monocytes: 7 %
NEUTROS ABS: 4.8 10*3/uL (ref 1.4–7.0)
Neutrophils: 55 %
PLATELETS: 281 10*3/uL (ref 150–379)
RBC: 4.12 x10E6/uL — ABNORMAL LOW (ref 4.14–5.80)
RDW: 14.6 % (ref 12.3–15.4)
WBC: 8.7 10*3/uL (ref 3.4–10.8)

## 2016-12-31 LAB — COMPREHENSIVE METABOLIC PANEL
A/G RATIO: 1.5 (ref 1.2–2.2)
ALT: 12 IU/L (ref 0–44)
AST: 14 IU/L (ref 0–40)
Albumin: 4.4 g/dL (ref 3.5–4.8)
Alkaline Phosphatase: 58 IU/L (ref 39–117)
BILIRUBIN TOTAL: 0.7 mg/dL (ref 0.0–1.2)
BUN/Creatinine Ratio: 14 (ref 10–24)
BUN: 17 mg/dL (ref 8–27)
CO2: 22 mmol/L (ref 20–29)
Calcium: 9.6 mg/dL (ref 8.6–10.2)
Chloride: 103 mmol/L (ref 96–106)
Creatinine, Ser: 1.23 mg/dL (ref 0.76–1.27)
GFR calc Af Amer: 68 mL/min/{1.73_m2} (ref >59)
GFR calc non Af Amer: 59 mL/min/{1.73_m2} — ABNORMAL LOW (ref >59)
GLUCOSE: 196 mg/dL — AB (ref 65–99)
Globulin, Total: 2.9 g/dL (ref 1.5–4.5)
POTASSIUM: 4.7 mmol/L (ref 3.5–5.2)
Sodium: 140 mmol/L (ref 134–144)
TOTAL PROTEIN: 7.3 g/dL (ref 6.0–8.5)

## 2016-12-31 LAB — LIPID PANEL WITH LDL/HDL RATIO
Cholesterol, Total: 97 mg/dL — ABNORMAL LOW (ref 100–199)
HDL: 30 mg/dL — ABNORMAL LOW (ref >39)
LDL Calculated: 47 mg/dL (ref 0–99)
LDL/HDL RATIO: 1.6 ratio (ref 0.0–3.6)
Triglycerides: 102 mg/dL (ref 0–149)
VLDL CHOLESTEROL CAL: 20 mg/dL (ref 5–40)

## 2016-12-31 LAB — TSH: TSH: 2.29 u[IU]/mL (ref 0.450–4.500)

## 2017-01-02 ENCOUNTER — Ambulatory Visit: Payer: PPO | Admitting: Urology

## 2017-01-02 ENCOUNTER — Encounter: Payer: Self-pay | Admitting: Urology

## 2017-01-02 VITALS — BP 170/65 | HR 75 | Ht 70.0 in | Wt 210.0 lb

## 2017-01-02 DIAGNOSIS — C61 Malignant neoplasm of prostate: Secondary | ICD-10-CM | POA: Diagnosis not present

## 2017-01-02 NOTE — Progress Notes (Signed)
11:08 AM  01/02/17   Juan Mack 1946-07-01 [id number]  Referring provider: Jerrol Banana., MD [address] [place], [place] [address]  CC: Elevated PSA  HPI: 71 y.o. 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  40 g prostate with small right sided nodule.    Most recent PSA have remained stable around 2- 2.5 ng/dL.    Last rectal exam on 06/28/2016 2+, some induration at the right base which was stable and documented on previous exams.  Endorectal MRI  01/2016 unremarkable.    He has had no changes GU status since his last visit.  No urinary complaints.     PMH: Past Medical History:  Diagnosis Date  . Anxiety   . Anxiety disorder   . BCC (basal cell carcinoma)   . Central vein occlusion of retina   . Dermatitis   . Diabetes mellitus, type II ([place])   . Elevated PSA   . Hyperlipemia   . Hyperlipidemia   . Hypertension   . Insomnia   . Insomnia   . OSA on CPAP   . Prostate nodule   . Pruritus   . Stable central retinal vein occlusion     Surgical History: Past Surgical History:  Procedure Laterality Date  . COLONOSCOPY WITH PROPOFOL N/A 03/21/2016   Procedure: COLONOSCOPY WITH PROPOFOL;  Surgeon: Manya Silvas, MD;  Location: [facility] ENDOSCOPY;  Service: Endoscopy;  Laterality: N/A;  . COLONOSCOPY WITH PROPOFOL N/A 11/13/2016   Procedure: COLONOSCOPY WITH PROPOFOL;  Surgeon: Manya Silvas, MD;  Location: [facility] ENDOSCOPY;  Service: Endoscopy;  Laterality: N/A;  . NO PAST SURGERIES      Home Medications:  Allergies as of 01/02/2017   No Known Allergies     Medication List       Accurate as of 01/02/17 11:08 AM. Always use your most recent med list.          amLODipine 5 MG tablet Commonly known as:  NORVASC TAKE 1 TABLET BY MOUTH EVERY DAY   aspirin 81 MG EC tablet Take 81 mg by mouth  daily. Swallow whole.   atorvastatin 20 MG tablet Commonly known as:  LIPITOR TAKE 1 TABLET(20 MG) BY MOUTH DAILY   benazepril 40 MG tablet Commonly known as:  LOTENSIN TAKE 1 TABLET BY MOUTH EVERY DAY   glucose blood test strip Use as instructed   latanoprost 0.005 % ophthalmic solution Commonly known as:  XALATAN Place 1 drop into both eyes at bedtime.   metFORMIN 1000 MG tablet Commonly known as:  GLUCOPHAGE TAKE 1 TABLET BY MOUTH TWICE DAILY   pioglitazone 45 MG tablet Commonly known as:  ACTOS Take 1 tablet (45 mg total) by mouth daily.       Allergies: No Known Allergies  Family History: Family History  Problem Relation Age of Onset  . Prostate cancer Brother        Father  . Diabetes Brother   . Colon cancer Brother   . Pancreatic cancer Sister   . Lung cancer Sister   . Coronary artery disease Father   . Heart disease Father   . Congestive Heart Failure Mother   . Diabetes Mother     Social History:  reports that he quit smoking about 43 years ago. His smoking use included Cigarettes. He quit  after 14.00 years of use. He has never used smokeless tobacco. He reports that he does not drink alcohol or use drugs.  ROS: UROLOGY Frequent Urination?: No Hard to postpone urination?: No Burning/pain with urination?: No Get up at night to urinate?: No Leakage of urine?: No Urine stream starts and stops?: No Trouble starting stream?: No Do you have to strain to urinate?: No Blood in urine?: No Urinary tract infection?: No Sexually transmitted disease?: No Injury to kidneys or bladder?: No Painful intercourse?: No Weak stream?: No Erection problems?: No Penile pain?: No  Gastrointestinal Nausea?: No Vomiting?: No Indigestion/heartburn?: No Diarrhea?: No Constipation?: No  Constitutional Fever: No Night sweats?: No Weight loss?: No Fatigue?: No  Skin Skin rash/lesions?: No Itching?: No  Eyes Blurred vision?: No Double vision?:  No  Ears/Nose/Throat Sore throat?: No Sinus problems?: No  Hematologic/Lymphatic Swollen glands?: No Easy bruising?: No  Cardiovascular Leg swelling?: No Chest pain?: No  Respiratory Cough?: No Shortness of breath?: No  Endocrine Excessive thirst?: No  Musculoskeletal Back pain?: No Joint pain?: No  Neurological Headaches?: No Dizziness?: No  Psychologic Depression?: No Anxiety?: No  Physical Exam: BP (!) 170/65   Pulse 75   Ht 5\' 10"  (1.778 m)   Wt 210 lb (95.3 kg)   BMI 30.13 kg/m   Constitutional:  Alert and oriented, No acute distress.  Accompanied by wife today.   HEENT: [place] AT, moist mucus membranes.  Trachea midline, no masses. Cardiovascular: No clubbing, cyanosis, or edema. Respiratory: Normal respiratory effort, no increased work of breathing. GI: Abdomen is soft, nontender, nondistended  DRE: Deferred today, will check annually as long as PSA remains stable (due in 6 months) Skin: No rashes, bruises or suspicious lesions.. Neurologic: Grossly intact, no focal deficits, moving all 4 extremities. Psychiatric: Normal mood and affect.  Laboratory Data: Lab Results  Component Value Date   WBC 8.7 12/30/2016   HGB 12.6 (L) 12/30/2016   HCT 38.3 12/30/2016   MCV 93 12/30/2016   PLT 281 12/30/2016    Lab Results  Component Value Date   CREATININE 1.23 12/30/2016   Component     Latest Ref Rng & Units 06/12/2015 12/15/2015 06/25/2016 12/24/2016  PSA     0.0 - 4.0 ng/mL 2.4 2.3 2.0 2.2    Lab Results  Component Value Date   HGBA1C 7.8 12/30/2016   Imaging: No new interval prostate / pelvic imaging    Assessment & Plan:    1. Very low risk prostate cancer, stable PSA  -Recommend continued active surveillance  -f/u in 6 months with PSA prior  Return in about 6 months (around 07/04/2017) for PSA/ DRE.  Hollice Espy, MD  [facility] [address] [place], [place], [place] [address] [phone]

## 2017-02-14 ENCOUNTER — Encounter: Payer: Self-pay | Admitting: Physician Assistant

## 2017-02-14 ENCOUNTER — Ambulatory Visit (INDEPENDENT_AMBULATORY_CARE_PROVIDER_SITE_OTHER): Payer: PPO | Admitting: Physician Assistant

## 2017-02-14 VITALS — BP 140/64 | HR 72 | Temp 98.4°F | Resp 16 | Wt 210.0 lb

## 2017-02-14 DIAGNOSIS — H9190 Unspecified hearing loss, unspecified ear: Secondary | ICD-10-CM | POA: Diagnosis not present

## 2017-02-14 DIAGNOSIS — T7029XA Other effects of high altitude, initial encounter: Secondary | ICD-10-CM
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
## 2017-02-14 NOTE — Patient Instructions (Signed)
Barotitis Media Barotitis media is inflammation of the middle ear. This condition occurs when an auditory tube (eustachian tube) is blocked in one or both ears. These tubes lead from the middle ear to the back of the nose (nasopharynx). This condition typically occurs when you experience changes in pressure, such as when flying or scuba diving. Untreated barotitis media may lead to damage or hearing loss (barotrauma), which may become permanent. What are the causes? This condition may be caused by changes in air pressure from:  Flying.  Scuba diving.  A nearby explosion. What increases the risk? The following factors may make you more likely to develop this condition:  Middle ear infection.  Sinus infection.  A cold.  Environmental allergies.  Small eustachian tubes.  Recent ear surgery. What are the signs or symptoms? Symptoms of this condition may include:  Ear pain.  Hearing loss. In severe cases, symptoms can include:  Dizziness and nausea (vertigo).  Temporary facial paralysis. How is this diagnosed? This condition is diagnosed based on:  A physical exam. Your health care provider may:  Use a device (otoscope) to look into your ear canal and check your eardrum.  Do a test that changes air pressure in the middle ear to check how well the eardrum moves and to see if the eustachian tube is working(tympanogram).  Your medical history. In some cases, your health care provider may have you take a hearing test. You may also be referred to someone who specializes in ear treatment (otolaryngologist, "ENT"). How is this treated? This condition may be treated with:  Medicines to relieve congestion in your nose, sinus, or upper respiratory tract (decongestants).  Techniques to equalize pressure (to "pop" your ears), such as:  Yawning.  Chewing gum.  Swallowing. In severe cases, you may need surgery to relieve your symptoms or to prevent future inflammation. Follow  these instructions at home:  Take over-the-counter and prescription medicines only as told by your health care provider.  Do not put anything into your ears to clean or unplug them. Ear drops will not help.  Keep all follow-up visits as told by your health care provider. This is important. How is this prevented? Using these strategies may help to prevent barotitis media:  Chewing gum with frequent, forceful swallowing during takeoff and landing when flying.  Holding your nose and gently blowing to pop your ears for equalizing pressure changes. This forces air into the eustachian tube.  Yawning during air pressure changes.  Using a nasal decongestant about 30-60 minutes before flying, if you have nasal congestion. Contact a health care provider if:  You have vertigo.  You have hearing loss.  Your symptoms do not get better or they get worse.  You have a fever. Get help right away if:  You have a severe headache, ear pain, and dizziness.  You have balance problems.  You cannot move or feel part of your face.  You have bloody or pus-like drainage from your ears. Summary  Barotitis media is inflammation of the middle ear.  This condition typically occurs when you experience changes in pressure, such as when flying or scuba diving.  You may be at a higher risk for this condition if you have small eustachian tubes, had recent ear surgery, or have allergies, a cold, or sinus or middle ear infection.  This condition may be treated with medicines or techniques to equalize pressure in your ears.  Strategies can be used to help prevent barotitis media. This information is   not intended to replace advice given to you by your health care provider. Make sure you discuss any questions you have with your health care provider. Document Released: 06/21/2000 Document Revised: 05/13/2016 Document Reviewed: 05/13/2016 Elsevier Interactive Patient Education  2017 Elsevier Inc.  

## 2017-03-18 DIAGNOSIS — H903 Sensorineural hearing loss, bilateral: Secondary | ICD-10-CM | POA: Diagnosis not present

## 2017-04-24 ENCOUNTER — Other Ambulatory Visit: Payer: Self-pay | Admitting: Family Medicine

## 2017-05-05 ENCOUNTER — Ambulatory Visit (INDEPENDENT_AMBULATORY_CARE_PROVIDER_SITE_OTHER): Payer: PPO | Admitting: Family Medicine

## 2017-05-05 VITALS — BP 152/62 | HR 60 | Temp 98.3°F | Resp 14 | Wt 213.0 lb

## 2017-05-05 DIAGNOSIS — E119 Type 2 diabetes mellitus without complications: Secondary | ICD-10-CM | POA: Diagnosis not present

## 2017-05-05 NOTE — Progress Notes (Signed)
Juan Mack  MRN: [id number] DOB: 1945-11-03  Subjective:  HPI   The patient is a 71 year old male who presents for follow up of his chronic disease.  He was last seen on 02/14/17 for an acute visit with one of the PA's in the office.  He was last seen for chronic issues on 12/30/16.   Diabetes-The patient had his last A1C on 12/30/16 and it was 7.8.  He does not check his glucose on a regular basis but states that he is expecting his A1C to be up higher today.  He states he had a procedure done by ENT and was put on Prednisone for about 1 week.  Hypertension-The patient has had a fluctuating blood pressure for the last several months. He is currently on Benazepril and Amlodpine.  BP Readings from Last 3 Encounters:  05/05/17 (!) 152/62  02/14/17 140/64  01/02/17 (!) 170/65    Patient Active Problem List   Diagnosis Date Noted  . Basal cell carcinoma of skin 12/01/2014  . 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 Special screening for malignant neoplasms, colon 12/01/2014  . Dermatitis, eczematoid 12/01/2014  . Polypharmacy 12/01/2014  . Essential (primary) hypertension 12/01/2014  . Anxiety, generalized 12/01/2014  . Flu vaccine need 12/01/2014  . HLD (hyperlipidemia) 12/01/2014  . Cannot sleep 12/01/2014  . Pruritic disorder 12/01/2014  . Obstructive apnea 12/01/2014  . Special screening for malignant neoplasm of prostate 12/01/2014  . Prostate lump 12/01/2014  . Scabies 12/01/2014  . Diabetes mellitus, type 2 ([place]) 12/01/2014    Past Medical History:  Diagnosis Date  . Anxiety   . Anxiety disorder   . BCC (basal cell carcinoma)   . Central vein occlusion of retina   . Dermatitis   . Diabetes mellitus, type II ([place])   . Elevated PSA   . Hyperlipemia   . Hyperlipidemia   . Hypertension   . Insomnia   . Insomnia   . OSA on CPAP   . Prostate nodule   . Pruritus   . Stable central retinal vein occlusion     Social History   Socioeconomic History  .  Marital status: Married    Spouse name: Not on file  . Number of children: Not on file  . Years of education: Not on file  . Highest education level: Not on file  Social Needs  . Financial resource strain: Not on file  . Food insecurity - worry: Not on file  . Food insecurity - inability: Not on file  . Transportation needs - medical: Not on file  . Transportation needs - non-medical: Not on file  Occupational History  . Not on file  Tobacco Use  . Smoking status: Former Smoker    Years: 14.00    Types: Cigarettes    Last attempt to quit: 07/08/1973    Years since quitting: 43.8  . Smokeless tobacco: Never Used  Substance and Sexual Activity  . Alcohol use: No    Alcohol/week: 0.0 oz  . Drug use: No  . Sexual activity: Not on file  Other Topics Concern  . Not on file  Social History Narrative  . Not on file    Outpatient Encounter Medications as of 05/05/2017  Medication Sig  . amLODipine (NORVASC) 5 MG tablet TAKE 1 TABLET BY MOUTH EVERY DAY  . aspirin 81 MG EC tablet Take 81 mg by mouth daily. Swallow whole.  Marland Kitchen atorvastatin (LIPITOR) 20 MG tablet TAKE 1 TABLET(20 MG)  BY MOUTH DAILY  . benazepril (LOTENSIN) 40 MG tablet TAKE 1 TABLET BY MOUTH EVERY DAY  . glucose blood test strip Use as instructed  . latanoprost (XALATAN) 0.005 % ophthalmic solution Place 1 drop into both eyes at bedtime.  . metFORMIN (GLUCOPHAGE) 1000 MG tablet TAKE 1 TABLET BY MOUTH TWICE DAILY  . pioglitazone (ACTOS) 45 MG tablet Take 1 tablet (45 mg total) by mouth daily. (Patient taking differently: Take 30 mg by mouth daily. )   No facility-administered encounter medications on file as of 05/05/2017.     No Known Allergies  Review of Systems  Constitutional: Negative for fever and malaise/fatigue.  Eyes: Negative.   Respiratory: Negative for shortness of breath and wheezing.   Cardiovascular: Negative for chest pain, palpitations, orthopnea, claudication and leg swelling.  Gastrointestinal:  Negative.   Genitourinary: Negative for frequency.  Skin: Negative.   Neurological: Negative.  Negative for weakness.  Endo/Heme/Allergies: Negative for polydipsia.  Psychiatric/Behavioral: Negative.     Objective:  BP (!) 152/62   Pulse 60   Temp 98.3 F (36.8 C) (Oral)   Resp 14   Wt 213 lb (96.6 kg)   BMI 30.56 kg/m   Physical Exam  Constitutional: He is oriented to person, place, and time and well-developed, well-nourished, and in no distress.  HENT:  Head: Normocephalic and atraumatic.  Right Ear: External ear normal.  Left Ear: External ear normal.  Nose: Nose normal.  Eyes: Conjunctivae are normal. No scleral icterus.  Neck: No thyromegaly present.  Cardiovascular: Normal rate, regular rhythm and normal heart sounds.   Pulmonary/Chest: Effort normal and breath sounds normal.  Abdominal: Soft.  Neurological: He is alert and oriented to person, place, and time. Gait normal. GCS score is 15.  Skin: Skin is warm and dry.  Psychiatric: Mood, memory, affect and judgment normal.    Assessment and Plan :  TIIIDM A1C is 8.1 today. RTC 3 months. HTN Get new cuff and check BP at home. HLD  I have done the exam and reviewed the chart and it is accurate to the best of my knowledge. Development worker, community has been used and  any errors in dictation or transcription are unintentional. Miguel Aschoff M.D. [facility]

## 2017-05-05 NOTE — Patient Instructions (Signed)
Check blood pressure and keep record. Bring it with you to your next office visit. We recommending getting a [employer] or [employer] BP cuff.

## 2017-05-19 DIAGNOSIS — H401131 Primary open-angle glaucoma, bilateral, mild stage: Secondary | ICD-10-CM | POA: Diagnosis not present

## 2017-05-20 ENCOUNTER — Encounter: Payer: Self-pay | Admitting: Family Medicine

## 2017-05-20 LAB — POCT GLYCOSYLATED HEMOGLOBIN (HGB A1C): HEMOGLOBIN A1C: 8.1

## 2017-05-21 DIAGNOSIS — E119 Type 2 diabetes mellitus without complications: Secondary | ICD-10-CM | POA: Diagnosis not present

## 2017-07-09 ENCOUNTER — Other Ambulatory Visit: Payer: PPO

## 2017-07-11 ENCOUNTER — Ambulatory Visit: Payer: PPO | Admitting: Urology

## 2017-07-16 ENCOUNTER — Other Ambulatory Visit: Payer: Self-pay

## 2017-07-16 DIAGNOSIS — C61 Malignant neoplasm of prostate: Secondary | ICD-10-CM

## 2017-07-17 ENCOUNTER — Other Ambulatory Visit: Payer: PPO

## 2017-07-17 DIAGNOSIS — C61 Malignant neoplasm of prostate: Secondary | ICD-10-CM

## 2017-07-18 LAB — PSA: Prostate Specific Ag, Serum: 2.8 ng/mL (ref 0.0–4.0)

## 2017-07-22 ENCOUNTER — Ambulatory Visit: Payer: PPO | Admitting: Urology

## 2017-07-22 ENCOUNTER — Encounter: Payer: Self-pay | Admitting: Urology

## 2017-07-22 VITALS — BP 179/66 | HR 76 | Ht 70.0 in | Wt 213.0 lb

## 2017-07-22 DIAGNOSIS — C61 Malignant neoplasm of prostate: Secondary | ICD-10-CM

## 2017-07-22 NOTE — Progress Notes (Signed)
4:42 PM  07/22/17   Juan Mack 1946-06-26 [id number]  Referring provider: Jerrol Banana., MD [address] [place], [place] [address]  CC: Elevated PSA  HPI: 72 y.o. 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  40 g prostate with small right sided nodule.    Most recent PSA have remained stable around 2- 2.5 ng/dL.    Last rectal exam on 06/28/2016 2+, some induration at the right base which was stable and documented on previous exams.   Endorectal MRI  01/2016 unremarkable.    He has had no changes GU status since his last visit.  No urinary complaints.  Denies any weight loss or bone pain.   PMH: Past Medical History:  Diagnosis Date  . Anxiety   . Anxiety disorder   . BCC (basal cell carcinoma)   . Central vein occlusion of retina   . Dermatitis   . Diabetes mellitus, type II ([place])   . Elevated PSA   . Hyperlipemia   . Hyperlipidemia   . Hypertension   . Insomnia   . Insomnia   . OSA on CPAP   . Prostate nodule   . Pruritus   . Stable central retinal vein occlusion     Surgical History: Past Surgical History:  Procedure Laterality Date  . COLONOSCOPY WITH PROPOFOL N/A 03/21/2016   Procedure: COLONOSCOPY WITH PROPOFOL;  Surgeon: Manya Silvas, MD;  Location: [facility] ENDOSCOPY;  Service: Endoscopy;  Laterality: N/A;  . COLONOSCOPY WITH PROPOFOL N/A 11/13/2016   Procedure: COLONOSCOPY WITH PROPOFOL;  Surgeon: Manya Silvas, MD;  Location[facility] ENDOSCOPY;  Service: Endoscopy;  Laterality: N/A;  . NO PAST SURGERIES      Home Medications:  Allergies as of 07/22/2017   No Known Allergies     Medication List        Accurate as of 07/22/17  4:42 PM. Always use your most recent med list.          amLODipine 5 MG tablet Commonly known as:  NORVASC TAKE 1 TABLET BY MOUTH EVERY DAY   aspirin 81  MG EC tablet Take 81 mg by mouth daily. Swallow whole.   atorvastatin 20 MG tablet Commonly known as:  LIPITOR TAKE 1 TABLET(20 MG) BY MOUTH DAILY   benazepril 40 MG tablet Commonly known as:  LOTENSIN TAKE 1 TABLET BY MOUTH EVERY DAY   glucose blood test strip Use as instructed   latanoprost 0.005 % ophthalmic solution Commonly known as:  XALATAN Place 1 drop into both eyes at bedtime.   metFORMIN 1000 MG tablet Commonly known as:  GLUCOPHAGE TAKE 1 TABLET BY MOUTH TWICE DAILY   pioglitazone 45 MG tablet Commonly known as:  ACTOS Take 1 tablet (45 mg total) by mouth daily.       Allergies: No Known Allergies  Family History: Family History  Problem Relation Age of Onset  . Prostate cancer Brother        Father  . Diabetes Brother   . Colon cancer Brother   . Pancreatic cancer Sister   . Lung cancer Sister   . Coronary artery disease Father   . Heart disease Father   . Congestive Heart Failure Mother   . Diabetes Mother     Social History:  reports that he quit smoking about 8  years ago. His smoking use included cigarettes. He quit after 14.00 years of use. he has never used smokeless tobacco. He reports that he does not drink alcohol or use drugs.  ROS: UROLOGY Frequent Urination?: No Hard to postpone urination?: No Burning/pain with urination?: No Get up at night to urinate?: No Leakage of urine?: No Urine stream starts and stops?: No Trouble starting stream?: No Do you have to strain to urinate?: No Blood in urine?: No Urinary tract infection?: No Sexually transmitted disease?: No Injury to kidneys or bladder?: No Painful intercourse?: No Weak stream?: No Erection problems?: No Penile pain?: No  Gastrointestinal Nausea?: No Vomiting?: No Indigestion/heartburn?: No Diarrhea?: No Constipation?: No  Constitutional Fever: No Night sweats?: No Weight loss?: No Fatigue?: No  Skin Skin rash/lesions?: No Itching?: No  Eyes Blurred  vision?: No Double vision?: No  Ears/Nose/Throat Sore throat?: No Sinus problems?: No  Hematologic/Lymphatic Swollen glands?: No Easy bruising?: No  Cardiovascular Leg swelling?: No Chest pain?: No  Respiratory Cough?: No Shortness of breath?: No  Endocrine Excessive thirst?: No  Musculoskeletal Back pain?: No Joint pain?: No  Neurological Headaches?: No Dizziness?: No  Psychologic Depression?: No Anxiety?: No  Physical Exam: BP (!) 179/66   Pulse 76   Ht 5\' 10"  (1.778 m)   Wt 213 lb (96.6 kg)   BMI 30.56 kg/m   Constitutional:  Alert and oriented, No acute distress.    HEENT: [place] AT, moist mucus membranes.  Trachea midline, no masses. Cardiovascular: No clubbing, cyanosis, or edema. Respiratory: Normal respiratory effort, no increased work of breathing.  DRE: Small external hemorrhoids, noninflamed.  Normal sphincter tone.  40 cc prostate, nontender, no nodules.  No significant induration palpable today.  No nodules. Skin: No rashes, bruises or suspicious lesions.. Neurologic: Grossly intact, no focal deficits, moving all 4 extremities. Psychiatric: Normal mood and affect.  Laboratory Data: Lab Results  Component Value Date   WBC 8.7 12/30/2016   HGB 12.6 (L) 12/30/2016   HCT 38.3 12/30/2016   MCV 93 12/30/2016   PLT 281 12/30/2016    Lab Results  Component Value Date   CREATININE 1.23 12/30/2016   Component     Latest Ref Rng & Units 06/12/2015 12/15/2015 06/25/2016 12/24/2016  Prostate Specific Ag, Serum     0.0 - 4.0 ng/mL 2.4 2.3 2.0 2.2   Component     Latest Ref Rng & Units 07/17/2017  Prostate Specific Ag, Serum     0.0 - 4.0 ng/mL 2.8    Lab Results  Component Value Date   HGBA1C 8.1 05/20/2017   Imaging: No new interval prostate / pelvic imaging    Assessment & Plan:    1. Very low risk prostate cancer, essentially stable PSA  -Recommend continued active surveillance  -f/u in 6 months with PSA prior  Return in about 6 months  (around 01/19/2018) for PSA.  Hollice Espy, MD  [facility] [address] [place], [place], [place] [address] [phone]

## 2017-09-08 ENCOUNTER — Other Ambulatory Visit: Payer: Self-pay

## 2017-09-08 ENCOUNTER — Ambulatory Visit (INDEPENDENT_AMBULATORY_CARE_PROVIDER_SITE_OTHER): Payer: PPO | Admitting: Family Medicine

## 2017-09-08 VITALS — BP 164/70 | HR 64 | Temp 98.1°F | Resp 16 | Wt 213.0 lb

## 2017-09-08 DIAGNOSIS — E119 Type 2 diabetes mellitus without complications: Secondary | ICD-10-CM

## 2017-09-08 DIAGNOSIS — I10 Essential (primary) hypertension: Secondary | ICD-10-CM

## 2017-09-08 LAB — POCT GLYCOSYLATED HEMOGLOBIN (HGB A1C): Hemoglobin A1C: 7.7

## 2017-09-08 MED ORDER — AMLODIPINE BESYLATE 10 MG PO TABS: 10.0000 mg | ORAL_TABLET | Freq: Every day | ORAL | 3 refills | Status: DC

## 2017-09-08 MED ORDER — ACCU-CHEK COMBO KIT: 1.0000 | PACK | Freq: Every day | 12 refills | Status: DC

## 2017-09-08 NOTE — Patient Instructions (Addendum)
Arm blood pressure cuff; Juan Mack or Juan Mack   Increase Amlodipine to 10 mg daily.  A new prescription has been sent to the pharmacy.  With the pills you have at home which are 5 mg, you can take 2 daily until they run out and then get the new prescription.

## 2017-09-08 NOTE — Progress Notes (Signed)
Juan Mack  MRN: [id number] DOB: 1945/11/25  Subjective:  HPI   The patient is a 72 year old male who presents for follow up of his chronic disease.  He was last seen on 05/05/17.  Diabetes-The patient had his A1C checked on the last visit and it was 8.1.  He is due for urine micro albumin and A1C today.  Patient states he has been checking his glucose at home and it runs generally 140-200 or so.  He died mention he needed a new meter sent in to his pharmacy.  Hypertension-the patient had elevated blood pressure on his last visit; 179/66.  He was instructed to get a new BP cuff for home and check it periodically.  He states he has not been checking his blood pressure.  BP Readings from Last 3 Encounters:  09/08/17 (!) 164/70  07/22/17 (!) 179/66  05/05/17 (!) 152/62    Patient Active Problem List   Diagnosis Date Noted  . Basal cell carcinoma of skin 12/01/2014  . 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 Special screening for malignant neoplasms, colon 12/01/2014  . Dermatitis, eczematoid 12/01/2014  . Polypharmacy 12/01/2014  . Essential (primary) hypertension 12/01/2014  . Anxiety, generalized 12/01/2014  . Flu vaccine need 12/01/2014  . HLD (hyperlipidemia) 12/01/2014  . Cannot sleep 12/01/2014  . Pruritic disorder 12/01/2014  . Obstructive apnea 12/01/2014  . Special screening for malignant neoplasm of prostate 12/01/2014  . Prostate lump 12/01/2014  . Scabies 12/01/2014  . Diabetes mellitus, type 2 ([place]) 12/01/2014    Past Medical History:  Diagnosis Date  . Anxiety   . Anxiety disorder   . BCC (basal cell carcinoma)   . Central vein occlusion of retina   . Dermatitis   . Diabetes mellitus, type II ([place])   . Elevated PSA   . Hyperlipemia   . Hyperlipidemia   . Hypertension   . Insomnia   . Insomnia   . OSA on CPAP   . Prostate nodule   . Pruritus   . Stable central retinal vein occlusion     Social History   Socioeconomic History  .  Marital status: Married    Spouse name: Not on file  . Number of children: Not on file  . Years of education: Not on file  . Highest education level: Not on file  Social Needs  . Financial resource strain: Not on file  . Food insecurity - worry: Not on file  . Food insecurity - inability: Not on file  . Transportation needs - medical: Not on file  . Transportation needs - non-medical: Not on file  Occupational History  . Not on file  Tobacco Use  . Smoking status: Former Smoker    Years: 14.00    Types: Cigarettes    Last attempt to quit: 07/08/1973    Years since quitting: 44.2  . Smokeless tobacco: Never Used  Substance and Sexual Activity  . Alcohol use: No    Alcohol/week: 0.0 oz  . Drug use: No  . Sexual activity: Not on file  Other Topics Concern  . Not on file  Social History Narrative  . Not on file    Outpatient Encounter Medications as of 09/08/2017  Medication Sig  . amLODipine (NORVASC) 5 MG tablet TAKE 1 TABLET BY MOUTH EVERY DAY  . aspirin 81 MG EC tablet Take 81 mg by mouth daily. Swallow whole.  Marland Kitchen atorvastatin (LIPITOR) 20 MG tablet TAKE 1 TABLET(20 MG) BY MOUTH  DAILY  . benazepril (LOTENSIN) 40 MG tablet TAKE 1 TABLET BY MOUTH EVERY DAY  . glucose blood test strip Use as instructed  . latanoprost (XALATAN) 0.005 % ophthalmic solution Place 1 drop into both eyes at bedtime.  . metFORMIN (GLUCOPHAGE) 1000 MG tablet TAKE 1 TABLET BY MOUTH TWICE DAILY  . pioglitazone (ACTOS) 45 MG tablet Take 1 tablet (45 mg total) by mouth daily.   No facility-administered encounter medications on file as of 09/08/2017.     No Known Allergies  Review of Systems  Constitutional: Negative for fever and malaise/fatigue.  HENT: Negative.   Eyes: Negative.   Respiratory: Negative for cough, shortness of breath and wheezing.   Cardiovascular: Negative for chest pain, palpitations, orthopnea, claudication and leg swelling.  Gastrointestinal: Negative.   Genitourinary: Negative  for frequency.  Skin: Negative.   Neurological: Negative.  Negative for weakness.  Endo/Heme/Allergies: Negative for polydipsia.  Psychiatric/Behavioral: Negative.     Objective:  BP (!) 164/70 (BP Location: Right Arm, Patient Position: Sitting, Cuff Size: Normal)   Pulse 64   Temp 98.1 F (36.7 C) (Oral)   Resp 16   Wt 213 lb (96.6 kg)   BMI 30.56 kg/m   Physical Exam  Constitutional: He is oriented to person, place, and time and well-developed, well-nourished, and in no distress.  HENT:  Head: Normocephalic and atraumatic.  Right Ear: External ear normal.  Left Ear: External ear normal.  Nose: Nose normal.  Eyes: Conjunctivae are normal. No scleral icterus.  Neck: No thyromegaly present.  Cardiovascular: Normal rate, regular rhythm and normal heart sounds.  Pulmonary/Chest: Effort normal and breath sounds normal.  Abdominal: Soft.  Neurological: He is alert and oriented to person, place, and time. Gait normal. GCS score is 15.  Skin: Skin is warm and dry.  Psychiatric: Mood, memory, affect and judgment normal.    Assessment and Plan :  1. Type 2 diabetes mellitus without complication, without long-term current use of insulin (HCC)  - POCT glycosylated hemoglobin (Hb A1C) - Insulin Infusion Pump (ACCU-CHEK COMBO) KIT; 1 strip by Does not apply route daily.  Dispense: 100 kit; Refill: 12  2. Essential (primary) hypertension  - amLODipine (NORVASC) 10 MG tablet; Take 1 tablet (10 mg total) by mouth daily.  Dispense: 90 tablet; Refill: 3  I have done the exam and reviewed the chart and it is accurate to the best of my knowledge. Development worker, community has been used and  any errors in dictation or transcription are unintentional. Miguel Aschoff M.D. [facility]

## 2017-09-15 ENCOUNTER — Other Ambulatory Visit: Payer: Self-pay | Admitting: Family Medicine

## 2017-09-15 DIAGNOSIS — E119 Type 2 diabetes mellitus without complications: Secondary | ICD-10-CM

## 2017-10-06 ENCOUNTER — Ambulatory Visit (INDEPENDENT_AMBULATORY_CARE_PROVIDER_SITE_OTHER): Payer: PPO | Admitting: Family Medicine

## 2017-10-06 ENCOUNTER — Encounter: Payer: Self-pay | Admitting: Family Medicine

## 2017-10-06 VITALS — BP 160/60 | HR 62 | Temp 98.1°F | Resp 16 | Wt 210.0 lb

## 2017-10-06 DIAGNOSIS — I10 Essential (primary) hypertension: Secondary | ICD-10-CM | POA: Diagnosis not present

## 2017-10-06 DIAGNOSIS — E119 Type 2 diabetes mellitus without complications: Secondary | ICD-10-CM | POA: Diagnosis not present

## 2017-10-06 LAB — POCT UA - MICROALBUMIN: Microalbumin Ur, POC: 100 mg/L

## 2017-10-06 NOTE — Progress Notes (Signed)
Patient: Juan Mack Male    DOB: October 27, 1945   72 y.o.   MRN: [id number] Visit Date: 10/06/2017  Today's Provider: Wilhemena Durie, MD   Chief Complaint  Patient presents with  . Hypertension   Subjective:    HPI  Hypertension, follow-up:  BP Readings from Last 3 Encounters:  10/06/17 (!) 160/60  09/08/17 (!) 164/70  07/22/17 (!) 179/66    He was last seen for hypertension 1 months ago.  BP at that visit was 164/70. Management since that visit includes increased Amlodipine to 10 mg daily. He reports good compliance with treatment. He is not having side effects.  Outside blood pressures are 120-155/68-74. Patient denies chest pain, chest pressure/discomfort, claudication, dyspnea, exertional chest pressure/discomfort, fatigue, irregular heart beat, lower extremity edema, near-syncope, orthopnea, palpitations, paroxysmal nocturnal dyspnea, syncope and tachypnea.   Wt Readings from Last 3 Encounters:  10/06/17 210 lb (95.3 kg)  09/08/17 213 lb (96.6 kg)  07/22/17 213 lb (96.6 kg)   ------------------------------------------------------------------------      No Known Allergies   Current Outpatient Medications:  .  amLODipine (NORVASC) 10 MG tablet, Take 1 tablet (10 mg total) by mouth daily., Disp: 90 tablet, Rfl: 3 .  aspirin 81 MG EC tablet, Take 81 mg by mouth daily. Swallow whole., Disp: , Rfl:  .  atorvastatin (LIPITOR) 20 MG tablet, TAKE 1 TABLET(20 MG) BY MOUTH DAILY, Disp: 90 tablet, Rfl: 3 .  benazepril (LOTENSIN) 40 MG tablet, TAKE 1 TABLET BY MOUTH EVERY DAY, Disp: 30 tablet, Rfl: 12 .  glucose blood test strip, Use as instructed, Disp: 100 each, Rfl: 12 .  Insulin Infusion Pump (ACCU-CHEK COMBO) KIT, 1 strip by Does not apply route daily., Disp: 100 kit, Rfl: 12 .  latanoprost (XALATAN) 0.005 % ophthalmic solution, Place 1 drop into both eyes at bedtime., Disp: , Rfl:  .  metFORMIN (GLUCOPHAGE) 1000 MG tablet, TAKE 1 TABLET BY MOUTH TWICE DAILY,  Disp: 180 tablet, Rfl: 3 .  pioglitazone (ACTOS) 45 MG tablet, TAKE 1 TABLET(45 MG) BY MOUTH DAILY, Disp: 90 tablet, Rfl: 3  Review of Systems  Constitutional: Negative.   HENT: Negative.   Eyes: Negative.   Respiratory: Negative.   Cardiovascular: Negative.   Gastrointestinal: Negative.   Endocrine: Negative.   Genitourinary: Negative.   Musculoskeletal: Negative.   Skin: Negative.   Allergic/Immunologic: Negative.   Neurological: Negative.   Hematological: Negative.   Psychiatric/Behavioral: Negative.     Social History   Tobacco Use  . Smoking status: Former Smoker    Years: 14.00    Types: Cigarettes    Last attempt to quit: 07/08/1973    Years since quitting: 44.2  . Smokeless tobacco: Never Used  Substance Use Topics  . Alcohol use: No    Alcohol/week: 0.0 oz   Objective:   BP (!) 160/60 (BP Location: Left Arm, Patient Position: Sitting, Cuff Size: Normal)   Pulse 62   Temp 98.1 F (36.7 C) (Oral)   Resp 16   Wt 210 lb (95.3 kg)   BMI 30.13 kg/m  Vitals:   10/06/17 0925  BP: (!) 160/60  Pulse: 62  Resp: 16  Temp: 98.1 F (36.7 C)  TempSrc: Oral  Weight: 210 lb (95.3 kg)     Physical Exam  Constitutional: He is oriented to person, place, and time. He appears well-developed and well-nourished.  HENT:  Head: Normocephalic and atraumatic.  Eyes: Conjunctivae are normal.  Neck: No thyromegaly present.  Cardiovascular: Normal rate, regular rhythm and normal heart sounds.  Pulmonary/Chest: Effort normal and breath sounds normal.  Abdominal: Soft.  Neurological: He is alert and oriented to person, place, and time.  Skin: Skin is warm and dry.  Psychiatric: He has a normal mood and affect. His behavior is normal. Judgment and thought content normal.        Assessment & Plan:     1. Essential (primary) hypertension Watch Systolic BP.  2. Type 2 diabetes mellitus without complication, without long-term current use of insulin (HCC)  - POCT UA -  Microalbumin--100 on ACEI.      I have done the exam and reviewed the chart and it is accurate to the best of my knowledge. Development worker, community has been used and  any errors in dictation or transcription are unintentional. Miguel Aschoff M.D. [place], MD  [facility]

## 2017-10-15 ENCOUNTER — Other Ambulatory Visit: Payer: Self-pay

## 2017-10-15 ENCOUNTER — Ambulatory Visit (INDEPENDENT_AMBULATORY_CARE_PROVIDER_SITE_OTHER): Payer: PPO | Admitting: Family Medicine

## 2017-10-15 VITALS — BP 148/70 | HR 76 | Temp 98.3°F | Resp 16 | Wt 208.0 lb

## 2017-10-15 DIAGNOSIS — L739 Follicular disorder, unspecified: Secondary | ICD-10-CM

## 2017-10-15 DIAGNOSIS — E119 Type 2 diabetes mellitus without complications: Secondary | ICD-10-CM

## 2017-10-15 MED ORDER — DOXYCYCLINE HYCLATE 100 MG PO TABS: 100.0000 mg | ORAL_TABLET | Freq: Two times a day (BID) | ORAL | 0 refills | Status: DC

## 2017-10-15 NOTE — Progress Notes (Signed)
Juan Mack  MRN: [id number] DOB: 1945/11/26  Subjective:  HPI   The patient is a 72 year old male who presents for evaluation of soreness and redness on the inside of his nose.  He states he has had sores in his nose before but none that lasted this long.  He states he has had it for 5 days and wanted to see about getting it drained.  Patient Active Problem List   Diagnosis Date Noted  . Basal cell carcinoma of skin 12/01/2014  . 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 Special screening for malignant neoplasms, colon 12/01/2014  . Dermatitis, eczematoid 12/01/2014  . Polypharmacy 12/01/2014  . Essential (primary) hypertension 12/01/2014  . Anxiety, generalized 12/01/2014  . Flu vaccine need 12/01/2014  . HLD (hyperlipidemia) 12/01/2014  . Cannot sleep 12/01/2014  . Pruritic disorder 12/01/2014  . Obstructive apnea 12/01/2014  . Special screening for malignant neoplasm of prostate 12/01/2014  . Prostate lump 12/01/2014  . Scabies 12/01/2014  . Diabetes mellitus, type 2 ([place]) 12/01/2014    Past Medical History:  Diagnosis Date  . Anxiety   . Anxiety disorder   . BCC (basal cell carcinoma)   . Central vein occlusion of retina   . Dermatitis   . Diabetes mellitus, type II ([place])   . Elevated PSA   . Hyperlipemia   . Hyperlipidemia   . Hypertension   . Insomnia   . Insomnia   . OSA on CPAP   . Prostate nodule   . Pruritus   . Stable central retinal vein occlusion     Social History   Socioeconomic History  . Marital status: Married    Spouse name: Not on file  . Number of children: Not on file  . Years of education: Not on file  . Highest education level: Not on file  Occupational History  . Not on file  Social Needs  . Financial resource strain: Not on file  . Food insecurity:    Worry: Not on file    Inability: Not on file  . Transportation needs:    Medical: Not on file    Non-medical: Not on file  Tobacco Use  . Smoking status: Former  Smoker    Years: 14.00    Types: Cigarettes    Last attempt to quit: 07/08/1973    Years since quitting: 44.3  . Smokeless tobacco: Never Used  Substance and Sexual Activity  . Alcohol use: No    Alcohol/week: 0.0 oz  . Drug use: No  . Sexual activity: Not on file  Lifestyle  . Physical activity:    Days per week: Not on file    Minutes per session: Not on file  . Stress: Not on file  Relationships  . Social connections:    Talks on phone: Not on file    Gets together: Not on file    Attends religious service: Not on file    Active member of club or organization: Not on file    Attends meetings of clubs or organizations: Not on file    Relationship status: Not on file  . Intimate partner violence:    Fear of current or ex partner: Not on file    Emotionally abused: Not on file    Physically abused: Not on file    Forced sexual activity: Not on file  Other Topics Concern  . Not on file  Social History Narrative  . Not on file  Outpatient Encounter Medications as of 10/15/2017  Medication Sig  . amLODipine (NORVASC) 10 MG tablet Take 1 tablet (10 mg total) by mouth daily.  Marland Kitchen aspirin 81 MG EC tablet Take 81 mg by mouth daily. Swallow whole.  Marland Kitchen atorvastatin (LIPITOR) 20 MG tablet TAKE 1 TABLET(20 MG) BY MOUTH DAILY  . benazepril (LOTENSIN) 40 MG tablet TAKE 1 TABLET BY MOUTH EVERY DAY  . glucose blood test strip Use as instructed  . Insulin Infusion Pump (ACCU-CHEK COMBO) KIT 1 strip by Does not apply route daily.  Marland Kitchen latanoprost (XALATAN) 0.005 % ophthalmic solution Place 1 drop into both eyes at bedtime.  . metFORMIN (GLUCOPHAGE) 1000 MG tablet TAKE 1 TABLET BY MOUTH TWICE DAILY  . pioglitazone (ACTOS) 45 MG tablet TAKE 1 TABLET(45 MG) BY MOUTH DAILY   No facility-administered encounter medications on file as of 10/15/2017.     No Known Allergies  Review of Systems  Constitutional: Negative for fever.  HENT: Negative for congestion, ear discharge, ear pain,  nosebleeds, sinus pain and sore throat.   Respiratory: Negative for cough, shortness of breath and wheezing.   Cardiovascular: Negative for chest pain.    Objective:  BP (!) 148/70 (BP Location: Right Arm, Patient Position: Sitting, Cuff Size: Normal)   Pulse 76   Temp 98.3 F (36.8 C) (Oral)   Resp 16   Wt 208 lb (94.3 kg)   BMI 29.84 kg/m   Physical Exam  Constitutional: He is well-developed, well-nourished, and in no distress.  HENT:  Head: Normocephalic and atraumatic.  Right nares inflamed,erythematous. Small pustule inside right nares.  Eyes: No scleral icterus.  Cardiovascular: Normal rate and normal heart sounds.  Pulmonary/Chest: Effort normal.  Lymphadenopathy:    He has no cervical adenopathy.  Skin: Skin is warm and dry.  Psychiatric: Mood, memory, affect and judgment normal.    Assessment and Plan :   Cellulitis/folliculitis of nose Rx with doxycycline.RTC prn. TIIDM  I have done the exam and reviewed the chart and it is accurate to the best of my knowledge. Development worker, community has been used and  any errors in dictation or transcription are unintentional. Miguel Aschoff M.D. [facility]

## 2017-11-17 DIAGNOSIS — H401131 Primary open-angle glaucoma, bilateral, mild stage: Secondary | ICD-10-CM | POA: Diagnosis not present

## 2017-12-20 ENCOUNTER — Other Ambulatory Visit: Payer: Self-pay | Admitting: Family Medicine

## 2018-01-16 ENCOUNTER — Other Ambulatory Visit: Payer: Self-pay | Admitting: Family Medicine

## 2018-01-19 ENCOUNTER — Other Ambulatory Visit: Payer: PPO

## 2018-01-19 ENCOUNTER — Other Ambulatory Visit: Payer: Self-pay

## 2018-01-19 DIAGNOSIS — C61 Malignant neoplasm of prostate: Secondary | ICD-10-CM | POA: Diagnosis not present

## 2018-01-20 LAB — PSA: Prostate Specific Ag, Serum: 2.4 ng/mL (ref 0.0–4.0)

## 2018-01-21 ENCOUNTER — Ambulatory Visit: Payer: PPO | Admitting: Urology

## 2018-02-10 ENCOUNTER — Encounter: Payer: PPO | Admitting: Family Medicine

## 2018-02-27 ENCOUNTER — Encounter: Payer: Self-pay | Admitting: Urology

## 2018-02-27 ENCOUNTER — Ambulatory Visit: Payer: PPO | Admitting: Urology

## 2018-02-27 VITALS — BP 154/71 | Ht 70.0 in | Wt 205.0 lb

## 2018-02-27 DIAGNOSIS — C61 Malignant neoplasm of prostate: Secondary | ICD-10-CM | POA: Diagnosis not present

## 2018-02-27 NOTE — Progress Notes (Signed)
02/27/2018 10:57 AM   Juan Mack [birth year] [id number]  Referring provider: Jerrol Banana., MD [address] [place], [place] [address]  Chief Complaint  Patient presents with  . Prostate Cancer    46mo   HPI: 72y.o. 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  40 g prostate with small right sided nodule.    PSA have remained stable around 2- 2.5 ng/dL.    Most recent PSA 2.4 on 01/19/2018.  Most recent rectal exam 07/2017, 40 cc prostate, nontender without nodules.  No appreciable induration.  Endorectal MRI  01/2016 unremarkable.    He has had no changes GU status since his last visit.  No urinary complaints.  Denies any weight loss or bone pain.  Unchanged from last visit.    PMH: Past Medical History:  Diagnosis Date  . Anxiety   . Anxiety disorder   . BCC (basal cell carcinoma)   . Central vein occlusion of retina   . Dermatitis   . Diabetes [place]   . Elevated PSA   . Hyperlipemia   . Hyperlipidemia   . Hypertension   . Insomnia   . Insomnia   . OSA on CPAP   . Prostate nodule   . Pruritus   . Stable central retinal vein occlusion     Surgical History: Past Surgical History:  Procedure Laterality Date  . COLONOSCOPY WITH PROPOFOL N/A 03/21/2016   Procedure: COLONOSCOPY WITH PROPOFOL;  Surgeon: RManya Silvas MD;  [facility];  Service: Endoscopy;  Laterality: N/A;  . COLONOSCOPY WITH PROPOFOL N/A 11/13/2016   Procedure: COLONOSCOPY WITH PROPOFOL;  Surgeon: EManya Silvas MD;  [facility];  Service: Endoscopy;  Laterality: N/A;  . NO PAST SURGERIES      Home Medications:  Allergies as of 02/27/2018   No Known Allergies     Medication List        Accurate as of 02/27/18 10:57 AM. Always use your most recent med list.          ACCU-CHEK  COMBO Kit 1 strip by Does not apply route daily.   amLODipine 10 MG tablet Commonly known as:  NORVASC Take 1 tablet (10 mg total) by mouth daily.   aspirin 81 MG EC tablet Take 81 mg by mouth daily. Swallow whole.   atorvastatin 20 MG tablet Commonly known as:  LIPITOR TAKE 1 TABLET(20 MG) BY MOUTH DAILY   benazepril 40 MG tablet Commonly known as:  LOTENSIN TAKE 1 TABLET BY MOUTH EVERY DAY   glucose blood test strip Use as instructed   latanoprost 0.005 % ophthalmic solution Commonly known as:  XALATAN Place 1 drop into both eyes at bedtime.   metFORMIN 1000 MG tablet Commonly known as:  GLUCOPHAGE TAKE 1 TABLET BY MOUTH TWICE DAILY   pioglitazone 45 MG tablet Commonly known as:  ACTOS TAKE 1 TABLET(45 MG) BY MOUTH DAILY       Allergies: No Known Allergies  Family History: Family History  Problem Relation Age of Onset  . Prostate cancer Brother        Father  . Diabetes Brother   . Colon cancer Brother   . Pancreatic cancer Sister   . Lung cancer Sister   . Coronary artery disease Father   . Heart  disease Father   . Congestive Heart Failure Mother   . Diabetes Mother     Social History:  reports that he quit smoking about 44 years ago. His smoking use included cigarettes. He quit after 14.00 years of use. He has never used smokeless tobacco. He reports that he does not drink alcohol or use drugs.  ROS: UROLOGY Frequent Urination?: No Hard to postpone urination?: No Burning/pain with urination?: No Get up at night to urinate?: No Leakage of urine?: No Urine stream starts and stops?: No Trouble starting stream?: No Do you have to strain to urinate?: No Blood in urine?: No Urinary tract infection?: No Sexually transmitted disease?: No Injury to kidneys or bladder?: No Painful intercourse?: No Weak stream?: No Erection problems?: No Penile pain?: No  Gastrointestinal Nausea?: No Vomiting?: No Indigestion/heartburn?: No Diarrhea?:  No Constipation?: No  Constitutional Fever: No Night sweats?: No Weight loss?: No Fatigue?: No  Skin Skin rash/lesions?: No Itching?: No  Eyes Blurred vision?: No Double vision?: No  Ears/Nose/Throat Sore throat?: No Sinus problems?: No  Hematologic/Lymphatic Swollen glands?: No Easy bruising?: No  Cardiovascular Leg swelling?: No Chest pain?: No  Respiratory Cough?: No Shortness of breath?: No  Endocrine Excessive thirst?: No  Musculoskeletal Back pain?: No Joint pain?: No  Neurological Headaches?: No Dizziness?: No  Psychologic Depression?: No Anxiety?: No  Physical Exam: BP (!) 154/71   Ht 5' 10" (1.778 m)   Wt 205 lb (93 kg)   BMI 29.41 kg/m   Constitutional:  Alert and oriented, No acute distress. HEENT: [place] AT, moist mucus membranes.  Trachea midline, no masses. Cardiovascular: No clubbing, cyanosis, or edema.s. Neurologic: Grossly intact, no focal deficits, moving all 4 extremities. Psychiatric: Normal mood and affect.  Laboratory Data: Lab Results  Component Value Date   WBC 8.7 12/30/2016   HGB 12.6 (L) 12/30/2016   HCT 38.3 12/30/2016   MCV 93 12/30/2016   PLT 281 12/30/2016    Lab Results  Component Value Date   CREATININE 1.23 12/30/2016    Lab Results  Component Value Date   HGBA1C 7.7 09/08/2017   PSA as above.  Urinalysis NA  Pertinent Imaging: NA  Assessment & Plan:    1. Prostate cancer [facility]) On active surveillance for very low risk prostate cancer PSA is remained stable Plan for follow-up PSA/DRE in 6 months No urinary symptoms - PSA; Future   Return in about 6 months (around 08/30/2018) for PSA/ DRE.  Hollice Espy, MD  [facility] [address], [place], [place] [address] [phone]

## 2018-03-21 ENCOUNTER — Other Ambulatory Visit: Payer: Self-pay | Admitting: Family Medicine

## 2018-03-26 ENCOUNTER — Ambulatory Visit (INDEPENDENT_AMBULATORY_CARE_PROVIDER_SITE_OTHER): Payer: PPO | Admitting: Family Medicine

## 2018-03-26 ENCOUNTER — Ambulatory Visit (INDEPENDENT_AMBULATORY_CARE_PROVIDER_SITE_OTHER): Payer: PPO

## 2018-03-26 VITALS — BP 142/54 | HR 77 | Temp 98.5°F | Resp 16 | Ht 70.0 in | Wt 201.0 lb

## 2018-03-26 VITALS — BP 142/54 | HR 77 | Temp 98.5°F | Ht 70.0 in | Wt 201.6 lb

## 2018-03-26 DIAGNOSIS — E7800 Pure hypercholesterolemia, unspecified: Secondary | ICD-10-CM

## 2018-03-26 DIAGNOSIS — E119 Type 2 diabetes mellitus without complications: Secondary | ICD-10-CM | POA: Diagnosis not present

## 2018-03-26 DIAGNOSIS — Z0000 Encounter for general adult medical examination without abnormal findings: Secondary | ICD-10-CM

## 2018-03-26 DIAGNOSIS — I10 Essential (primary) hypertension: Secondary | ICD-10-CM

## 2018-03-26 DIAGNOSIS — Z23 Encounter for immunization: Secondary | ICD-10-CM | POA: Diagnosis not present

## 2018-03-26 NOTE — Patient Instructions (Addendum)
Mr. Juan Mack , Thank you for taking time to come for your Medicare Wellness Visit. I appreciate your ongoing commitment to your health goals. Please review the following plan we discussed and let me know if I can assist you in the future.   Screening recommendations/referrals: Colonoscopy: Up to date Recommended yearly ophthalmology/optometry visit for glaucoma screening and checkup Recommended yearly dental visit for hygiene and checkup  Vaccinations: Influenza vaccine: Up to date Pneumococcal vaccine: Up to date Tdap vaccine: Up to date Shingles vaccine: Pt declines today.     Advanced directives: Advance directive discussed with you today. Even though you declined this today please call our office should you change your mind and we can give you the proper paperwork for you to fill out.  Conditions/risks identified: Recommend to continue to increase water intake to at least 4 glasses a day.   Next appointment: 9:40 AM today with Dr Rosanna Randy.   Preventive Care 18 Years and Older, Male Preventive care refers to lifestyle choices and visits with your health care provider that can promote health and wellness. What does preventive care include?  A yearly physical exam. This is also called an annual well check.  Dental exams once or twice a year.  Routine eye exams. Ask your health care provider how often you should have your eyes checked.  Personal lifestyle choices, including:  Daily care of your teeth and gums.  Regular physical activity.  Eating a healthy diet.  Avoiding tobacco and drug use.  Limiting alcohol use.  Practicing safe sex.  Taking low doses of aspirin every day.  Taking vitamin and mineral supplements as recommended by your health care provider. What happens during an annual well check? The services and screenings done by your health care provider during your annual well check will depend on your age, overall health, lifestyle risk factors, and family history  of disease. Counseling  Your health care provider may ask you questions about your:  Alcohol use.  Tobacco use.  Drug use.  Emotional well-being.  Home and relationship well-being.  Sexual activity.  Eating habits.  History of falls.  Memory and ability to understand (cognition).  Work and work Statistician. Screening  You may have the following tests or measurements:  Height, weight, and BMI.  Blood pressure.  Lipid and cholesterol levels. These may be checked every 5 years, or more frequently if you are over 43 years old.  Skin check.  Lung cancer screening. You may have this screening every year starting at age 30 if you have a 30-pack-year history of smoking and currently smoke or have quit within the past 15 years.  Fecal occult blood test (FOBT) of the stool. You may have this test every year starting at age 75.  Flexible sigmoidoscopy or colonoscopy. You may have a sigmoidoscopy every 5 years or a colonoscopy every 10 years starting at age 57.  Prostate cancer screening. Recommendations will vary depending on your family history and other risks.  Hepatitis C blood test.  Hepatitis B blood test.  Sexually transmitted disease (STD) testing.  Diabetes screening. This is done by checking your blood sugar (glucose) after you have not eaten for a while (fasting). You may have this done every 1-3 years.  Abdominal aortic aneurysm (AAA) screening. You may need this if you are a current or former smoker.  Osteoporosis. You may be screened starting at age 33 if you are at high risk. Talk with your health care provider about your test results, treatment options,  and if necessary, the need for more tests. Vaccines  Your health care provider may recommend certain vaccines, such as:  Influenza vaccine. This is recommended every year.  Tetanus, diphtheria, and acellular pertussis (Tdap, Td) vaccine. You may need a Td booster every 10 years.  Zoster vaccine. You may  need this after age 83.  Pneumococcal 13-valent conjugate (PCV13) vaccine. One dose is recommended after age 44.  Pneumococcal polysaccharide (PPSV23) vaccine. One dose is recommended after age 31. Talk to your health care provider about which screenings and vaccines you need and how often you need them. This information is not intended to replace advice given to you by your health care provider. Make sure you discuss any questions you have with your health care provider. Document Released: 07/21/2015 Document Revised: 03/13/2016 Document Reviewed: 04/25/2015 Elsevier Interactive Patient Education  2017 [place] Prevention in the Home Falls can cause injuries. They can happen to people of all ages. There are many things you can do to make your home safe and to help prevent falls. What can I do on the outside of my home?  Regularly fix the edges of walkways and driveways and fix any cracks.  Remove anything that might make you trip as you walk through a door, such as a raised step or threshold.  Trim any bushes or trees on the path to your home.  Use bright outdoor lighting.  Clear any walking paths of anything that might make someone trip, such as rocks or tools.  Regularly check to see if handrails are loose or broken. Make sure that both sides of any steps have handrails.  Any raised decks and porches should have guardrails on the edges.  Have any leaves, snow, or ice cleared regularly.  Use sand or salt on walking paths during winter.  Clean up any spills in your garage right away. This includes oil or grease spills. What can I do in the bathroom?  Use night lights.  Install grab bars by the toilet and in the tub and shower. Do not use towel bars as grab bars.  Use non-skid mats or decals in the tub or shower.  If you need to sit down in the shower, use a plastic, non-slip stool.  Keep the floor dry. Clean up any water that spills on the floor as soon as it  happens.  Remove soap buildup in the tub or shower regularly.  Attach bath mats securely with double-sided non-slip rug tape.  Do not have throw rugs and other things on the floor that can make you trip. What can I do in the bedroom?  Use night lights.  Make sure that you have a light by your bed that is easy to reach.  Do not use any sheets or blankets that are too big for your bed. They should not hang down onto the floor.  Have a firm chair that has side arms. You can use this for support while you get dressed.  Do not have throw rugs and other things on the floor that can make you trip. What can I do in the kitchen?  Clean up any spills right away.  Avoid walking on wet floors.  Keep items that you use a lot in easy-to-reach places.  If you need to reach something above you, use a strong step stool that has a grab bar.  Keep electrical cords out of the way.  Do not use floor polish or wax that makes floors slippery. If  you must use wax, use non-skid floor wax.  Do not have throw rugs and other things on the floor that can make you trip. What can I do with my stairs?  Do not leave any items on the stairs.  Make sure that there are handrails on both sides of the stairs and use them. Fix handrails that are broken or loose. Make sure that handrails are as long as the stairways.  Check any carpeting to make sure that it is firmly attached to the stairs. Fix any carpet that is loose or worn.  Avoid having throw rugs at the top or bottom of the stairs. If you do have throw rugs, attach them to the floor with carpet tape.  Make sure that you have a light switch at the top of the stairs and the bottom of the stairs. If you do not have them, ask someone to add them for you. What else can I do to help prevent falls?  Wear shoes that:  Do not have high heels.  Have rubber bottoms.  Are comfortable and fit you well.  Are closed at the toe. Do not wear sandals.  If you  use a stepladder:  Make sure that it is fully opened. Do not climb a closed stepladder.  Make sure that both sides of the stepladder are locked into place.  Ask someone to hold it for you, if possible.  Clearly mark and make sure that you can see:  Any grab bars or handrails.  First and last steps.  Where the edge of each step is.  Use tools that help you move around (mobility aids) if they are needed. These include:  Canes.  Walkers.  Scooters.  Crutches.  Turn on the lights when you go into a dark area. Replace any light bulbs as soon as they burn out.  Set up your furniture so you have a clear path. Avoid moving your furniture around.  If any of your floors are uneven, fix them.  If there are any pets around you, be aware of where they are.  Review your medicines with your doctor. Some medicines can make you feel dizzy. This can increase your chance of falling. Ask your doctor what other things that you can do to help prevent falls. This information is not intended to replace advice given to you by your health care provider. Make sure you discuss any questions you have with your health care provider. Document Released: 04/20/2009 Document Revised: 11/30/2015 Document Reviewed: 07/29/2014 Elsevier Interactive Patient Education  2017 [employer].

## 2018-03-26 NOTE — Progress Notes (Signed)
Patient: Juan Mack, Male    DOB: 1946-05-07, 72 y.o.   MRN: [id number] Visit Date: 03/26/2018  Today's Provider: Wilhemena Durie, MD   Chief Complaint  Patient presents with  . Annual Exam   Subjective:  Juan Mack is a 72 y.o. male who presents today for health maintenance and complete physical. He feels well. He reports exercising daily. He reports he is sleeping well.  11/13/16 Colonoscopy, Vira Agar  Review of Systems  Constitutional: Negative.   HENT: Negative.   Eyes: Negative.   Respiratory: Negative.   Cardiovascular: Negative.   Gastrointestinal: Negative.   Endocrine: Negative.   Genitourinary: Negative.   Musculoskeletal: Negative.   Skin: Negative.   Allergic/Immunologic: Negative.   Neurological: Negative.   Hematological: Negative.   Psychiatric/Behavioral: Negative.     Social History   Socioeconomic History  . Marital status: Married    Spouse name: Not on file  . Number of children: 1  . Years of education: Not on file  . Highest education level: High school graduate  Occupational History  . Occupation: retired  Scientific laboratory technician  . Financial resource strain: Not hard at all  . Food insecurity:    Worry: Never true    Inability: Never true  . Transportation needs:    Medical: No    Non-medical: No  Tobacco Use  . Smoking status: Former Smoker    Years: 14.00    Types: Cigarettes    Last attempt to quit: 07/08/1973    Years since quitting: 44.7  . Smokeless tobacco: Never Used  Substance and Sexual Activity  . Alcohol use: No    Alcohol/week: 0.0 standard drinks  . Drug use: No  . Sexual activity: Not on file  Lifestyle  . Physical activity:    Days per week: Not on file    Minutes per session: Not on file  . Stress: Not at all  Relationships  . Social connections:    Talks on phone: Not on file    Gets together: Not on file    Attends religious service: Not on file    Active member of club or organization: Not on file    Attends  meetings of clubs or organizations: Not on file    Relationship status: Not on file  . Intimate partner violence:    Fear of current or ex partner: Not on file    Emotionally abused: Not on file    Physically abused: Not on file    Forced sexual activity: Not on file  Other Topics Concern  . Not on file  Social History Narrative  . Not on file    Patient Active Problem List   Diagnosis Date Noted  . Basal cell carcinoma of skin 12/01/2014  . 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 Special screening for malignant neoplasms, colon 12/01/2014  . Dermatitis, eczematoid 12/01/2014  . Polypharmacy 12/01/2014  . Essential (primary) hypertension 12/01/2014  . Anxiety, generalized 12/01/2014  . Flu vaccine need 12/01/2014  . HLD (hyperlipidemia) 12/01/2014  . Cannot sleep 12/01/2014  . Pruritic disorder 12/01/2014  . Obstructive apnea 12/01/2014  . Special screening for malignant neoplasm of prostate 12/01/2014  . Prostate lump 12/01/2014  . Scabies 12/01/2014  . Diabetes mellitus, type 2 ([place]) 12/01/2014    Past Surgical History:  Procedure Laterality Date  . COLONOSCOPY WITH PROPOFOL N/A 03/21/2016   Procedure: COLONOSCOPY WITH PROPOFOL;  Surgeon: Manya Silvas, MD;  Location: [facility] ENDOSCOPY;  Service: Endoscopy;  Laterality: N/A;  . COLONOSCOPY WITH PROPOFOL N/A 11/13/2016   Procedure: COLONOSCOPY WITH PROPOFOL;  Surgeon: Manya Silvas, MD;  Location[facility] ENDOSCOPY;  Service: Endoscopy;  Laterality: N/A;  . NO PAST SURGERIES      His family history includes Colon cancer in his brother; Congestive Heart Failure in his mother; Coronary artery disease in his father; Diabetes in his brother and mother; Heart disease in his father; Lung cancer in his sister; Pancreatic cancer in his sister; Prostate cancer in his brother.     Outpatient Encounter Medications as of 03/26/2018  Medication Sig  . amLODipine (NORVASC) 10 MG tablet Take 1 tablet (10 mg total) by mouth daily.   Marland Kitchen aspirin 81 MG EC tablet Take 81 mg by mouth daily. Swallow whole.  Marland Kitchen atorvastatin (LIPITOR) 20 MG tablet TAKE 1 TABLET(20 MG) BY MOUTH DAILY  . benazepril (LOTENSIN) 40 MG tablet TAKE 1 TABLET BY MOUTH EVERY DAY  . glucose blood test strip Use as instructed  . Insulin Infusion Pump (ACCU-CHEK COMBO) KIT 1 strip by Does not apply route daily. (Patient not taking: Reported on 03/26/2018)  . latanoprost (XALATAN) 0.005 % ophthalmic solution Place 1 drop into both eyes at bedtime.  . metFORMIN (GLUCOPHAGE) 1000 MG tablet TAKE 1 TABLET BY MOUTH TWICE DAILY  . pioglitazone (ACTOS) 45 MG tablet TAKE 1 TABLET(45 MG) BY MOUTH DAILY   No facility-administered encounter medications on file as of 03/26/2018.     Patient Care Team: Jerrol Banana., MD as PCP - General (Family Medicine) Manya Silvas, MD as Consulting Physician (Gastroenterology) Hollice Espy, MD as Consulting Physician (Urology) Eulogio Bear, MD as Consulting Physician (Ophthalmology)      Objective:   Vitals:  Vitals:   03/26/18 0913  BP: (!) 142/54  Pulse: 77  Temp: 98.5 F (36.9 C)  TempSrc: Oral  SpO2: (!) 16%  Weight: 201 lb (91.2 kg)  Height: 5' 10"  (5.284 m)    Physical Exam  Constitutional: He is oriented to person, place, and time. He appears well-developed and well-nourished.  HENT:  Head: Normocephalic and atraumatic.  Right Ear: External ear normal.  Left Ear: External ear normal.  Nose: Nose normal.  Mouth/Throat: Oropharynx is clear and moist.  Eyes: Pupils are equal, round, and reactive to light. Conjunctivae and EOM are normal.  Neck: Normal range of motion. Neck supple.  Cardiovascular: Normal rate, regular rhythm, normal heart sounds and intact distal pulses.  Pulmonary/Chest: Effort normal and breath sounds normal.  Abdominal: Soft. Bowel sounds are normal.  Genitourinary: Rectum normal, prostate normal and penis normal.  Musculoskeletal: Normal range of motion.   Neurological: He is alert and oriented to person, place, and time.  Skin: Skin is warm and dry.  Mild senile purpura  Psychiatric: He has a normal mood and affect. His behavior is normal. Judgment and thought content normal.     Depression Screen PHQ 2/9 Scores 03/26/2018 03/26/2018 10/24/2016 10/24/2016  PHQ - 2 Score 0 0 0 0  PHQ- 9 Score 0 - 0 -      Assessment & Plan:     Routine Health Maintenance and Physical Exam  Exercise Activities and Dietary recommendations Goals    . Increase water intake     Recommend increasing water intake to 4 glasses a day.       Immunization History  Administered Date(s) Administered  . Influenza Split 05/21/2011  . Influenza, High Dose Seasonal PF 04/25/2014, 03/26/2018  . Influenza,inj,Quad PF,6+ Mos 04/07/2013  .  Influenza-Unspecified 04/15/2017  . Pneumococcal Conjugate-13 12/14/2013  . Pneumococcal Polysaccharide-23 02/28/2011  . Td 07/27/2003, 02/13/2015    Health Maintenance  Topic Date Due  . FOOT EXAM  12/30/2017  . HEMOGLOBIN A1C  03/11/2018  . OPHTHALMOLOGY EXAM  11/06/2018  . TETANUS/TDAP  02/12/2025  . COLONOSCOPY  11/14/2026  . INFLUENZA VACCINE  Completed  . Hepatitis C Screening  Completed  . PNA vac Low Risk Adult  Completed     Discussed health benefits of physical activity, and encouraged him to engage in regular exercise appropriate for his age and condition.    I have done the exam and reviewed the chart and it is accurate to the best of my knowledge. Development worker, community has been used and  any errors in dictation or transcription are unintentional. Miguel Aschoff M.D. [facility]

## 2018-03-26 NOTE — Progress Notes (Signed)
Subjective:   Juan Mack is a 72 y.o. male who presents for Medicare Annual/Subsequent preventive examination.  Review of Systems:  N/A  Cardiac Risk Factors include: advanced age (>40mn, >>49women);diabetes mellitus;dyslipidemia;hypertension;male gender     Objective:    Vitals: BP (!) 142/54 (BP Location: Right Arm)   Pulse 77   Temp 98.5 F (36.9 C) (Oral)   Ht 5' 10"  (1.778 m)   Wt 201 lb 9.6 oz (91.4 kg)   BMI 28.93 kg/m   Body mass index is 28.93 kg/m.  Advanced Directives 03/26/2018 10/24/2016 05/20/2016 03/21/2016 12/25/2015 10/24/2015 02/13/2015  Does Patient Have a Medical Advance Directive? No No No No No No No  Would patient like information on creating a medical advance directive? No - Patient declined No - Patient declined - No - patient declined information - - -    Tobacco Social History   Tobacco Use  Smoking Status Former Smoker  . Years: 14.00  . Types: Cigarettes  . Last attempt to quit: 07/08/1973  . Years since quitting: 44.7  Smokeless Tobacco Never Used     Counseling given: Not Answered   Clinical Intake:  Pre-visit preparation completed: Yes  Pain : No/denies pain Pain Score: 0-No pain     Nutritional Status: BMI 25 -29 Overweight Nutritional Risks: None Diabetes: Yes(type 2) CBG done?: No Did pt. bring in CBG monitor from home?: No  How often do you need to have someone help you when you read instructions, pamphlets, or other written materials from your doctor or pharmacy?: 1 - Never  Interpreter Needed?: No  Information [facility] :: [facility]  Past Medical History:  Diagnosis Date  . Anxiety   . Anxiety disorder   . BCC (basal cell carcinoma)   . Central vein occlusion of retina   . Dermatitis   . [place] II [place]   . Elevated PSA   . Hyperlipemia   . Hyperlipidemia   . Hypertension   . Insomnia   . Insomnia   . OSA on CPAP   . Prostate nodule   . Pruritus   . Stable central retinal vein  occlusion    Past Surgical History:  Procedure Laterality Date  . COLONOSCOPY WITH PROPOFOL N/A 03/21/2016   Procedure: COLONOSCOPY WITH PROPOFOL;  Surgeon: RManya Silvas MD;  [facility];  Service: Endoscopy;  Laterality: N/A;  . COLONOSCOPY WITH PROPOFOL N/A 11/13/2016   Procedure: COLONOSCOPY WITH PROPOFOL;  Surgeon: EManya Silvas MD;  [facility];  Service: End[facility];  Laterality: N/A;  . NO PAST SURGERIES     Family History  Problem Relation Age of Onset  . Prostate cancer Brother        Father  . Diabetes Brother   . Colon cancer Brother   . Pancreatic cancer Sister   . Lung cancer Sister   . Coronary artery disease Father   . Heart disease Father   . Congestive Heart Failure Mother   . Diabetes Mother    Social History   Socioeconomic History  . Marital status: Married    Spouse name: Not on file  . Number of children: 1  . Years of education: Not on file  . Highest education level: High school graduate  Occupational History  . Occupation: retired  SScientific laboratory technician . Financial resource strain: Not hard at all  . Food insecurity:    Worry: Never true    Inability: Never true  . Transportation needs:  Medical: No    Non-medical: No  Tobacco Use  . Smoking status: Former Smoker    Years: 14.00    Types: Cigarettes    Last attempt to quit: 07/08/1973    Years since quitting: 44.7  . Smokeless tobacco: Never Used  Substance and Sexual Activity  . Alcohol use: No    Alcohol/week: 0.0 standard drinks  . Drug use: No  . Sexual activity: Not on file  Lifestyle  . Physical activity:    Days per week: Not on file    Minutes per session: Not on file  . Stress: Not at all  Relationships  . Social connections:    Talks on phone: Not on file    Gets together: Not on file    Attends religious service: Not on file    Active member of club or organization: Not on file    Attends meetings of clubs or organizations: Not on file     Relationship status: Not on file  Other Topics Concern  . Not on file  Social History Narrative  . Not on file    Outpatient Encounter Medications as of 03/26/2018  Medication Sig  . amLODipine (NORVASC) 10 MG tablet Take 1 tablet (10 mg total) by mouth daily.  Marland Kitchen aspirin 81 MG EC tablet Take 81 mg by mouth daily. Swallow whole.  Marland Kitchen atorvastatin (LIPITOR) 20 MG tablet TAKE 1 TABLET(20 MG) BY MOUTH DAILY  . benazepril (LOTENSIN) 40 MG tablet TAKE 1 TABLET BY MOUTH EVERY DAY  . glucose blood test strip Use as instructed  . latanoprost (XALATAN) 0.005 % ophthalmic solution Place 1 drop into both eyes at bedtime.  . metFORMIN (GLUCOPHAGE) 1000 MG tablet TAKE 1 TABLET BY MOUTH TWICE DAILY  . pioglitazone (ACTOS) 45 MG tablet TAKE 1 TABLET(45 MG) BY MOUTH DAILY  . Insulin Infusion Pump (ACCU-CHEK COMBO) KIT 1 strip by Does not apply route daily. (Patient not taking: Reported on 03/26/2018)   No facility-administered encounter medications on file as of 03/26/2018.     Activities of Daily Living In your present state of health, do you have any difficulty performing the following activities: 03/26/2018  Hearing? Y  Comment Does not wear hearing aids. Has seen Dr Tami Ribas for this.  Vision? N  Difficulty concentrating or making decisions? N  Walking or climbing stairs? N  Dressing or bathing? N  Doing errands, shopping? N  Preparing Food and eating ? N  Using the Toilet? N  In the past six months, have you accidently leaked urine? N  Do you have problems with loss of bowel control? N  Managing your Medications? N  Managing your Finances? N  Housekeeping or managing your Housekeeping? N  Some recent data might be hidden    Patient Care Team: Jerrol Banana., MD as PCP - General (Family Medicine) Manya Silvas, MD as Consulting Physician (Gastroenterology) Hollice Espy, MD as Consulting Physician (Urology) Eulogio Bear, MD as Consulting Physician (Ophthalmology)     Assessment:   This is a routine wellness examination for Juan Mack.  Exercise Activities and Dietary recommendations Current Exercise Habits: The patient does not participate in regular exercise at present, Exercise limited by: None identified  Goals    . Increase water intake     Recommend increasing water intake to 4 glasses a day.       Fall Risk Fall Risk  03/26/2018 10/24/2016 10/24/2015 06/22/2015  Falls in the past year? No No No No   Is  the patient's home free of loose throw rugs in walkways, pet beds, electrical cords, etc?   yes      Grab bars in the bathroom? no      Handrails on the stairs?   yes      Adequate lighting?   yes  Timed Get Up and Go Performed: N/A  Depression Screen PHQ 2/9 Scores 03/26/2018 03/26/2018 10/24/2016 10/24/2016  PHQ - 2 Score 0 0 0 0  PHQ- 9 Score 0 - 0 -    Cognitive Function: Pt declined screening today.      6CIT Screen 10/24/2016  What Year? 0 points  What month? 0 points  What time? 0 points  Count back from 20 0 points  Months in reverse 0 points  Repeat phrase 2 points  Total Score 2    Immunization History  Administered Date(s) Administered  . Influenza Split 05/21/2011  . Influenza, High Dose Seasonal PF 04/25/2014, 03/26/2018  . Influenza,inj,Quad PF,6+ Mos 04/07/2013  . Influenza-Unspecified 04/15/2017  . Pneumococcal Conjugate-13 12/14/2013  . Pneumococcal Polysaccharide-23 02/28/2011  . Td 07/27/2003, 02/13/2015    Qualifies for Shingles Vaccine? Due for Shingles vaccine. Declined my offer to administer today. Education has been provided regarding the importance of this vaccine. Pt has been advised to call her insurance company to determine her out of pocket expense. Advised she may also receive this vaccine at her local pharmacy or Health Dept. Verbalized acceptance and understanding.  Screening Tests Health Maintenance  Topic Date Due  . FOOT EXAM  12/30/2017  . HEMOGLOBIN A1C  03/11/2018  . OPHTHALMOLOGY EXAM   11/06/2018  . TETANUS/TDAP  02/12/2025  . COLONOSCOPY  11/14/2026  . INFLUENZA VACCINE  Completed  . Hepatitis C Screening  Completed  . PNA vac Low Risk Adult  Completed   Cancer Screenings: Lung: Low Dose CT Chest recommended if Age 13-80 years, 30 pack-year currently smoking OR have quit w/in 15years. Patient does not qualify. Colorectal: Up to date  Additional Screenings:  Hepatitis C Screening: Up to date      Plan:  I have personally reviewed and addressed the Medicare Annual Wellness questionnaire and have noted the following in the patient's chart:  A. Medical and social history B. Use of alcohol, tobacco or illicit drugs  C. Current medications and supplements D. Functional ability and status E.  Nutritional status F.  Physical activity G. Advance directives H. List of other physicians I.  Hospitalizations, surgeries, and ER visits in previous 12 months J.  [place] such as hearing and vision if needed, cognitive and depression L. Referrals and appointments - none  In addition, I have reviewed and discussed with patient certain preventive protocols, quality metrics, and best practice recommendations. A written personalized care plan for preventive services as well as general preventive health recommendations were provided to patient.  See attached scanned questionnaire for additional information.   Signed,  Fabio Neighbors, LPN Nurse Health Advisor   Nurse Recommendations: Pt needs a diabetic foot exam today.

## 2018-03-27 LAB — LIPID PANEL WITH LDL/HDL RATIO
CHOLESTEROL TOTAL: 103 mg/dL (ref 100–199)
HDL: 29 mg/dL — AB (ref >39)
LDL Calculated: 53 mg/dL (ref 0–99)
LDl/HDL Ratio: 1.8 ratio (ref 0.0–3.6)
Triglycerides: 105 mg/dL (ref 0–149)
VLDL Cholesterol Cal: 21 mg/dL (ref 5–40)

## 2018-03-27 LAB — COMPREHENSIVE METABOLIC PANEL
ALK PHOS: 56 IU/L (ref 39–117)
ALT: 8 IU/L (ref 0–44)
AST: 13 IU/L (ref 0–40)
Albumin/Globulin Ratio: 1.6 (ref 1.2–2.2)
Albumin: 4.3 g/dL (ref 3.5–4.8)
BUN/Creatinine Ratio: 16 (ref 10–24)
BUN: 21 mg/dL (ref 8–27)
Bilirubin Total: 0.8 mg/dL (ref 0.0–1.2)
CALCIUM: 9.6 mg/dL (ref 8.6–10.2)
CO2: 20 mmol/L (ref 20–29)
CREATININE: 1.3 mg/dL — AB (ref 0.76–1.27)
Chloride: 104 mmol/L (ref 96–106)
GFR calc Af Amer: 63 mL/min/{1.73_m2} (ref >59)
GFR, EST NON AFRICAN AMERICAN: 55 mL/min/{1.73_m2} — AB (ref >59)
GLOBULIN, TOTAL: 2.7 g/dL (ref 1.5–4.5)
GLUCOSE: 149 mg/dL — AB (ref 65–99)
Potassium: 4.4 mmol/L (ref 3.5–5.2)
Sodium: 140 mmol/L (ref 134–144)
Total Protein: 7 g/dL (ref 6.0–8.5)

## 2018-03-27 LAB — CBC WITH DIFFERENTIAL/PLATELET
BASOS ABS: 0.1 10*3/uL (ref 0.0–0.2)
Basos: 1 %
EOS (ABSOLUTE): 0.2 10*3/uL (ref 0.0–0.4)
EOS: 3 %
HEMATOCRIT: 35.8 % — AB (ref 37.5–51.0)
Hemoglobin: 11.8 g/dL — ABNORMAL LOW (ref 13.0–17.7)
IMMATURE GRANULOCYTES: 0 %
Immature Grans (Abs): 0 10*3/uL (ref 0.0–0.1)
LYMPHS ABS: 2 10*3/uL (ref 0.7–3.1)
Lymphs: 27 %
MCH: 31.2 pg (ref 26.6–33.0)
MCHC: 33 g/dL (ref 31.5–35.7)
MCV: 95 fL (ref 79–97)
MONOS ABS: 0.7 10*3/uL (ref 0.1–0.9)
Monocytes: 10 %
NEUTROS PCT: 59 %
Neutrophils Absolute: 4.2 10*3/uL (ref 1.4–7.0)
PLATELETS: 261 10*3/uL (ref 150–450)
RBC: 3.78 x10E6/uL — AB (ref 4.14–5.80)
RDW: 13.1 % (ref 12.3–15.4)
WBC: 7.1 10*3/uL (ref 3.4–10.8)

## 2018-03-27 LAB — HEMOGLOBIN A1C
ESTIMATED AVERAGE GLUCOSE: 157 mg/dL
HEMOGLOBIN A1C: 7.1 % — AB (ref 4.8–5.6)

## 2018-03-27 LAB — TSH: TSH: 1.56 u[IU]/mL (ref 0.450–4.500)

## 2018-04-01 ENCOUNTER — Telehealth: Payer: Self-pay

## 2018-04-01 NOTE — Telephone Encounter (Signed)
pt returned call ° °Thanks teri °

## 2018-04-01 NOTE — Telephone Encounter (Signed)
Patient was advised. KW 

## 2018-04-01 NOTE — Telephone Encounter (Signed)
[id number] 

## 2018-04-01 NOTE — Telephone Encounter (Signed)
-----   Message from Jerrol Banana., MD sent at 03/31/2018 10:20 AM EDT ----- OK--would repeat CBC 1-2 months.

## 2018-05-18 DIAGNOSIS — H401131 Primary open-angle glaucoma, bilateral, mild stage: Secondary | ICD-10-CM | POA: Diagnosis not present

## 2018-05-20 ENCOUNTER — Other Ambulatory Visit: Payer: Self-pay | Admitting: Family Medicine

## 2018-05-26 DIAGNOSIS — E119 Type 2 diabetes mellitus without complications: Secondary | ICD-10-CM | POA: Diagnosis not present

## 2018-05-26 LAB — HM DIABETES EYE EXAM

## 2018-08-25 ENCOUNTER — Other Ambulatory Visit: Payer: PPO

## 2018-08-25 DIAGNOSIS — C61 Malignant neoplasm of prostate: Secondary | ICD-10-CM | POA: Diagnosis not present

## 2018-08-26 LAB — PSA: Prostate Specific Ag, Serum: 3 ng/mL (ref 0.0–4.0)

## 2018-08-27 ENCOUNTER — Other Ambulatory Visit: Payer: Self-pay

## 2018-08-27 ENCOUNTER — Encounter: Payer: Self-pay | Admitting: Family Medicine

## 2018-08-27 ENCOUNTER — Ambulatory Visit (INDEPENDENT_AMBULATORY_CARE_PROVIDER_SITE_OTHER): Payer: PPO | Admitting: Family Medicine

## 2018-08-27 VITALS — BP 146/60 | HR 75 | Temp 98.6°F | Ht 71.0 in | Wt 200.6 lb

## 2018-08-27 DIAGNOSIS — E119 Type 2 diabetes mellitus without complications: Secondary | ICD-10-CM

## 2018-08-27 DIAGNOSIS — J069 Acute upper respiratory infection, unspecified: Secondary | ICD-10-CM

## 2018-08-27 LAB — POCT GLYCOSYLATED HEMOGLOBIN (HGB A1C): HEMOGLOBIN A1C: 7.3 % — AB (ref 4.0–5.6)

## 2018-08-27 NOTE — Patient Instructions (Signed)
Start OTC Robitussin (generic version) for cough.

## 2018-08-27 NOTE — Progress Notes (Signed)
Patient: Juan Mack Male    DOB: March 06, 1946   73 y.o.   MRN: [id number] Visit Date: 08/27/2018  Today's Provider: Wilhemena Durie, MD   Chief Complaint  Patient presents with  . Follow-up    5 month DM   Subjective:     HPI   Diabetes Mellitus Type II, Follow-up:   Lab Results  Component Value Date   HGBA1C 7.1 (H) 03/26/2018   HGBA1C 7.7 09/08/2017   HGBA1C 8.1 05/20/2017    Last seen for diabetes 5 months ago. 146 60  Management since then includes no changes. He reports excellent compliance with treatment. He is not having side effects.  Current symptoms include none and have been stable. Home blood sugar records: fasting range: 160 to 170  Episodes of hypoglycemia? no   Current Insulin Regimen: not using Most Recent Eye Exam: 06/2018 Weight trend: stable Prior visit with dietician: no Current diet: in general, a "healthy" diet   Current exercise: walking  Pertinent Labs:    Component Value Date/Time   CHOL 103 03/26/2018 0954   TRIG 105 03/26/2018 0954   HDL 29 (L) 03/26/2018 0954   LDLCALC 53 03/26/2018 0954   CREATININE 1.30 (H) 03/26/2018 0954    Wt Readings from Last 3 Encounters:  08/27/18 200 lb 9.6 oz (91 kg)  03/26/18 201 lb (91.2 kg)  03/26/18 201 lb 9.6 oz (91.4 kg)    ------------------------------------------------------------------------   No Known Allergies   Current Outpatient Medications:  .  amLODipine (NORVASC) 10 MG tablet, Take 1 tablet (10 mg total) by mouth daily., Disp: 90 tablet, Rfl: 3 .  aspirin 81 MG EC tablet, Take 81 mg by mouth daily. Swallow whole., Disp: , Rfl:  .  atorvastatin (LIPITOR) 20 MG tablet, TAKE 1 TABLET(20 MG) BY MOUTH DAILY, Disp: 90 tablet, Rfl: 3 .  benazepril (LOTENSIN) 40 MG tablet, TAKE 1 TABLET BY MOUTH EVERY DAY, Disp: 30 tablet, Rfl: 11 .  glucose blood test strip, Use as instructed, Disp: 100 each, Rfl: 12 .  latanoprost (XALATAN) 0.005 % ophthalmic solution, Place 1 drop into  both eyes at bedtime., Disp: , Rfl:  .  metFORMIN (GLUCOPHAGE) 1000 MG tablet, TAKE 1 TABLET BY MOUTH TWICE DAILY, Disp: 180 tablet, Rfl: 3 .  pioglitazone (ACTOS) 45 MG tablet, TAKE 1 TABLET(45 MG) BY MOUTH DAILY, Disp: 90 tablet, Rfl: 3  Review of Systems  Constitutional: Negative.   HENT: Negative.   Eyes: Negative.   Respiratory: Negative.   Cardiovascular: Negative.   Gastrointestinal: Negative.   Endocrine: Negative.   Genitourinary: Negative.   Musculoskeletal: Negative.   Skin: Negative.   Allergic/Immunologic: Negative.   Neurological: Negative.   Hematological: Negative.   Psychiatric/Behavioral: Negative.     Social History   Tobacco Use  . Smoking status: Former Smoker    Years: 14.00    Types: Cigarettes    Last attempt to quit: 07/08/1973    Years since quitting: 45.1  . Smokeless tobacco: Never Used  Substance Use Topics  . Alcohol use: No    Alcohol/week: 0.0 standard drinks      Objective:   BP (!) 146/60 (BP Location: Left Arm, Patient Position: Sitting, Cuff Size: Normal)   Pulse 75   Temp 98.6 F (37 C) (Oral)   Ht 5\' 11"  (1.803 m)   Wt 200 lb 9.6 oz (91 kg)   SpO2 98%   BMI 27.98 kg/m  Vitals:   08/27/18 1038  BP: (!) 146/60  Pulse: 75  Temp: 98.6 F (37 C)  TempSrc: Oral  SpO2: 98%  Weight: 200 lb 9.6 oz (91 kg)  Height: 5\' 11"  (1.803 m)     Physical Exam Vitals signs reviewed.  Constitutional:      Appearance: He is well-developed.  HENT:     Head: Normocephalic and atraumatic.     Right Ear: External ear normal.     Left Ear: External ear normal.     Nose: Nose normal.  Eyes:     Conjunctiva/sclera: Conjunctivae normal.     Pupils: Pupils are equal, round, and reactive to light.  Neck:     Musculoskeletal: Normal range of motion and neck supple.  Cardiovascular:     Rate and Rhythm: Normal rate and regular rhythm.     Heart sounds: Normal heart sounds.  Pulmonary:     Effort: Pulmonary effort is normal.     Breath  sounds: Normal breath sounds.  Abdominal:     General: Bowel sounds are normal.     Palpations: Abdomen is soft.  Genitourinary:    Penis: Normal.      Prostate: Normal.     Rectum: Normal.  Musculoskeletal: Normal range of motion.  Skin:    General: Skin is warm and dry.     Comments: Mild senile purpura  Neurological:     General: No focal deficit present.     Mental Status: He is alert and oriented to person, place, and time. Mental status is at baseline.  Psychiatric:        Mood and Affect: Mood normal.        Behavior: Behavior normal.        Thought Content: Thought content normal.        Judgment: Judgment normal.         Assessment & Plan    1. Type 2 diabetes mellitus without complication, without long-term current use of insulin (HCC) A1c 7.3 today.  Good control. - POCT glycosylated hemoglobin (Hb A1C)  2. Upper respiratory tract infection, unspecified type Viral URI.  Use over-the-counter Robitussin.,  Fluids rest, Tylenol.    I have done the exam and reviewed the above chart and it is accurate to the best of my knowledge. Development worker, community has been used in this note in any air is in the dictation or transcription are unintentional.  Wilhemena Durie, MD  [place]

## 2018-08-28 NOTE — Progress Notes (Signed)
09/01/2018  9:04 AM   Juan Mack 08-06-1945 [id number]  Referring provider: Jerrol Banana., MD [address] [place], [place] [address]  Chief Complaint  Patient presents with  . Prostate Cancer    HPI: Juan Mack is a 73 y.o. White or Caucasian male that presents today for 6 month evaluation and management for history of low risk prostate cancer.  Diagnosed 11/2014 with low risk prostate cancer. Gleason score 6 (3+3) in the right mid lateral 1/2 biopsies 3% with 4.0 PSA.   Endorectal MRI in 01/2016 was unremarkable.  Currently on active surveillance. PSA remained stable around 2-2.5 ng/dL. Most recent PSA slightly elevated at 3.0 on 08/25/2018.  Patient reports no bothersome urinary symptoms at this time.  Overall pleased.  Patient has a history of Type 2 DM, controlled (HGBA1C 7.3). Managed by PCP.  PSA Trend 2.4 on 06/12/2015 2.3 on 12/15/2015 2.0 on 06/25/2016 2.2 on 12/24/2016 2.8 on 07/17/2017 2.4 on 01/19/2018 3.0 on 08/25/2018  PMH: Past Medical History:  Diagnosis Date  . Anxiety   . Anxiety disorder   . BCC (basal cell carcinoma)   . Central vein occlusion of retina   . Dermatitis   . Diabetes mellitus, type II ([place])   . Elevated PSA   . Hyperlipemia   . Hyperlipidemia   . Hypertension   . Insomnia   . Insomnia   . OSA on CPAP   . Prostate nodule   . Pruritus   . Stable central retinal vein occlusion     Surgical History: Past Surgical History:  Procedure Laterality Date  . COLONOSCOPY WITH PROPOFOL N/A 03/21/2016   Procedure: COLONOSCOPY WITH PROPOFOL;  Surgeon: Manya Silvas, MD;  Location: [facility] ENDOSCOPY;  Service: Endoscopy;  Laterality: N/A;  . COLONOSCOPY WITH PROPOFOL N/A 11/13/2016   Procedure: COLONOSCOPY WITH PROPOFOL;  Surgeon: Manya Silvas, MD;  Location: [facility] ENDOSCOPY;  Service: Endoscopy;  Laterality: N/A;  . NO PAST SURGERIES      Home Medications:  Allergies as of 09/01/2018   No Known Allergies       Medication List       Accurate as of September 01, 2018  9:04 AM. Always use your most recent med list.        amLODipine 10 MG tablet Commonly known as:  NORVASC Take 1 tablet (10 mg total) by mouth daily.   aspirin 81 MG EC tablet Take 81 mg by mouth daily. Swallow whole.   atorvastatin 20 MG tablet Commonly known as:  LIPITOR TAKE 1 TABLET(20 MG) BY MOUTH DAILY   benazepril 40 MG tablet Commonly known as:  LOTENSIN TAKE 1 TABLET BY MOUTH EVERY DAY   glucose blood test strip Use as instructed   latanoprost 0.005 % ophthalmic solution Commonly known as:  XALATAN Place 1 drop into both eyes at bedtime.   metFORMIN 1000 MG tablet Commonly known as:  GLUCOPHAGE TAKE 1 TABLET BY MOUTH TWICE DAILY   pioglitazone 45 MG tablet Commonly known as:  ACTOS TAKE 1 TABLET(45 MG) BY MOUTH DAILY       Allergies: No Known Allergies  Family History: Family History  Problem Relation Age of Onset  . Prostate cancer Brother        Father  . Diabetes Brother   . Colon cancer Brother   . Pancreatic cancer Sister   . Lung cancer Sister   . Coronary artery disease Father   . Heart disease Father   . Congestive  Heart Failure Mother   . Diabetes Mother     Social History:  reports that he quit smoking about 45 years ago. His smoking use included cigarettes. He quit after 14.00 years of use. He has never used smokeless tobacco. He reports that he does not drink alcohol or use drugs.  ROS: UROLOGY Frequent Urination?: No Hard to postpone urination?: No Burning/pain with urination?: No Get up at night to urinate?: No Leakage of urine?: No Urine stream starts and stops?: No Trouble starting stream?: No Do you have to strain to urinate?: No Blood in urine?: No Urinary tract infection?: No Sexually transmitted disease?: No Injury to kidneys or bladder?: No Painful intercourse?: No Weak stream?: No Erection problems?: No Penile pain?: No  Gastrointestinal Nausea?:  No Vomiting?: No Indigestion/heartburn?: No Diarrhea?: No Constipation?: No  Constitutional Fever: No Night sweats?: No Weight loss?: No Fatigue?: No  Skin Skin rash/lesions?: No Itching?: No  Eyes Blurred vision?: No Double vision?: No  Ears/Nose/Throat Sore throat?: No Sinus problems?: No  Hematologic/Lymphatic Swollen glands?: No Easy bruising?: No  Cardiovascular Leg swelling?: No Chest pain?: No  Respiratory Cough?: No Shortness of breath?: No  Endocrine Excessive thirst?: No  Musculoskeletal Back pain?: No Joint pain?: No  Neurological Headaches?: No Dizziness?: No  Psychologic Depression?: No Anxiety?: No  Physical Exam: BP (!) 168/76 (BP Location: Left Arm, Patient Position: Sitting, Cuff Size: Normal)   Pulse 78   Ht 5\' 11"  (1.803 m)   Wt 200 lb (90.7 kg)   BMI 27.89 kg/m   Constitutional:  Alert and oriented, No acute distress. Respiratory: Normal respiratory effort, no increased work of breathing. Head: Normocephalic and Atraumatic. GU: No CVA tenderness.  Rectal: Patient with normal sphincter tone. Prostate is approximately 40 grams, rubbery symmetric lateral lobes, no nodules are appreciated.  GU: No CVA tenderness Skin: No rashes, bruises or suspicious lesions. Neurologic: Grossly intact, no focal deficits, moving all 4 extremities. Psychiatric: Normal mood and affect.  Laboratory Data: Lab Results  Component Value Date   WBC 7.1 03/26/2018   HGB 11.8 (L) 03/26/2018   HCT 35.8 (L) 03/26/2018   MCV 95 03/26/2018   PLT 261 03/26/2018    Lab Results  Component Value Date   CREATININE 1.30 (H) 03/26/2018    Lab Results  Component Value Date   HGBA1C 7.3 (A) 08/27/2018   PSA Trend 2.4 on 06/12/2015 2.3 on 12/15/2015 2.0 on 06/25/2016 2.2 on 12/24/2016 2.8 on 07/17/2017 2.4 on 01/19/2018 3.0 on 08/25/2018  Assessment & Plan:   1. Prostate Cancer ([place])  - Low risk, on active surveillance  - PSA slightly elevated at  3.0, see below  - Plan for follow up with PSA in 1 year  2. Elevated PSA  - Last PSA (08/2018) was 3.0, elevated from baseline of 2-2.5  - DRE was unremarkable, no evidence of disease  - Discussed updated Prostate MRI procedure as well as further Fusion Biopsy diagnostic option as deemed necissary  - Plan to repeat Prostate MRI given slightly elevated PSA and last imaging in 2017. Patient is agreeable with plan, will call with results. Pending MRI results we may pursue repeat PSA in 6 months.  Return in about 1 year (around 09/02/2019) for visit with PSA.  [facility] [address] [place], [place] [address] [phone]  I, Temidayo Atanda-Ogunleye , am acting as a scribe for Hollice Espy, MD  I have reviewed the above documentation for accuracy and completeness, and I agree  with the above.   Hollice Espy, MD

## 2018-09-01 ENCOUNTER — Encounter: Payer: Self-pay | Admitting: Urology

## 2018-09-01 ENCOUNTER — Ambulatory Visit (INDEPENDENT_AMBULATORY_CARE_PROVIDER_SITE_OTHER): Payer: PPO | Admitting: Urology

## 2018-09-01 VITALS — BP 168/76 | HR 78 | Ht 71.0 in | Wt 200.0 lb

## 2018-09-01 DIAGNOSIS — C61 Malignant neoplasm of prostate: Secondary | ICD-10-CM

## 2018-09-08 ENCOUNTER — Other Ambulatory Visit: Payer: Self-pay | Admitting: Family Medicine

## 2018-09-08 DIAGNOSIS — E119 Type 2 diabetes mellitus without complications: Secondary | ICD-10-CM

## 2018-09-10 ENCOUNTER — Other Ambulatory Visit: Payer: Self-pay | Admitting: Family Medicine

## 2018-09-10 DIAGNOSIS — I10 Essential (primary) hypertension: Secondary | ICD-10-CM

## 2018-09-12 ENCOUNTER — Other Ambulatory Visit: Payer: Self-pay | Admitting: Family Medicine

## 2018-09-12 DIAGNOSIS — I10 Essential (primary) hypertension: Secondary | ICD-10-CM

## 2018-09-14 NOTE — Telephone Encounter (Signed)
Pt called for his refill on   Amlodipine 10 mg  X 90 days  [facility]  [address]

## 2018-09-15 ENCOUNTER — Ambulatory Visit
Admission: RE | Admit: 2018-09-15 | Discharge: 2018-09-15 | Disposition: A | Discharge status: Home / Self Care | Payer: PPO | Source: Ambulatory Visit | Attending: Urology | Admitting: Urology

## 2018-09-15 ENCOUNTER — Other Ambulatory Visit: Payer: Self-pay

## 2018-09-15 DIAGNOSIS — C61 Malignant neoplasm of prostate: Secondary | ICD-10-CM

## 2018-09-15 LAB — POCT I-STAT CREATININE: Creatinine, Ser: 1.1 mg/dL (ref 0.61–1.24)

## 2018-09-15 MED ORDER — GADOBUTROL 1 MMOL/ML IV SOLN
9.0000 mL | Freq: Once | INTRAVENOUS | Status: AC | PRN
  Administered 2018-09-15: 9 mL via INTRAVENOUS

## 2018-09-16 ENCOUNTER — Telehealth: Payer: Self-pay | Admitting: *Deleted

## 2018-09-16 NOTE — Telephone Encounter (Signed)
-----   Message from Hollice Espy, MD sent at 09/15/2018  6:57 PM EDT ----- Doristine Devoid news, your prostate MRI looked good.  There were no suspicious lesions which is what we would expect with low risk prostate cancer.  We will continue to follow you as previously discussed.  Hollice Espy, MD

## 2018-09-16 NOTE — Telephone Encounter (Signed)
Pt called back and I read him the message from Dr Erlene Quan. Pt voiced understanding.

## 2018-11-17 DIAGNOSIS — H2513 Age-related nuclear cataract, bilateral: Secondary | ICD-10-CM | POA: Diagnosis not present

## 2018-12-22 ENCOUNTER — Other Ambulatory Visit: Payer: Self-pay | Admitting: Family Medicine

## 2018-12-22 MED ORDER — METFORMIN HCL 1000 MG PO TABS: 1000.0000 mg | ORAL_TABLET | Freq: Two times a day (BID) | ORAL | 3 refills | Status: DC

## 2018-12-22 NOTE — Telephone Encounter (Signed)
Please review. Thanks!  

## 2018-12-22 NOTE — Telephone Encounter (Signed)
[facility] faxed refill request for the following medications:  metFORMIN (GLUCOPHAGE) 1000 MG tablet  90 day supply  Please advise. Thanks TNP

## 2019-01-25 ENCOUNTER — Ambulatory Visit: Payer: PPO | Admitting: Family Medicine

## 2019-03-20 ENCOUNTER — Other Ambulatory Visit: Payer: Self-pay | Admitting: Family Medicine

## 2019-03-25 NOTE — Progress Notes (Signed)
Subjective:   Juan Mack is a 73 y.o. male who presents for Medicare Annual/Subsequent preventive examination.    This visit is being conducted through telemedicine due to the COVID-19 pandemic. This patient has given me verbal consent via doximity to conduct this visit, patient states they are participating from their home address. Some vital signs may be absent or patient reported.    Patient identification: identified by name, DOB, and current address  Review of Systems:  N/A  Cardiac Risk Factors include: advanced age (>58men, >30 women);diabetes mellitus;dyslipidemia;hypertension;male gender     Objective:    Vitals: There were no vitals taken for this visit.  There is no height or weight on file to calculate BMI. Unable to obtain vitals due to visit being conducted via telephonically.   Advanced Directives 03/29/2019 03/26/2018 10/24/2016 05/20/2016 03/21/2016 12/25/2015 10/24/2015  Does Patient Have a Medical Advance Directive? No No No No No No No  Would patient like information on creating a medical advance directive? No - Patient declined No - Patient declined No - Patient declined - No - patient declined information - -    Tobacco Social History   Tobacco Use  Smoking Status Former Smoker  . Years: 14.00  . Types: Cigarettes  . Quit date: 07/08/1973  . Years since quitting: 45.7  Smokeless Tobacco Never Used     Counseling given: Not Answered   Clinical Intake:  Pre-visit preparation completed: Yes  Pain : No/denies pain Pain Score: 0-No pain     Nutritional Risks: None Diabetes: Yes  How often do you need to have someone help you when you read instructions, pamphlets, or other written materials from your doctor or pharmacy?: 1 - Never   Diabetes:  Is the patient diabetic?  Yes type 2 If diabetic, was a CBG obtained today?  No  Did the patient bring in their glucometer from home?  No  How often do you monitor your CBG's? Once daily.   Financial  Strains and Diabetes Management:  Are you having any financial strains with the device, your supplies or your medication? No .  Does the patient want to be seen by Chronic Care Management for management of their diabetes?  No  Would the patient like to be referred to a Nutritionist or for Diabetic Management?  No   Diabetic Exams:  Diabetic Eye Exam: Completed 05/26/18. Repeat yearly.  Diabetic Foot Exam: Completed 12/30/16. Pt has been advised about the importance in completing this exam. Note made to follow up on this at next in office visit.    Interpreter Needed?: No  Information entered by :: [facility], LPN  Past Medical History:  Diagnosis Date  . Anxiety   . Anxiety disorder   . BCC (basal cell carcinoma)   . Central vein occlusion of retina   . Dermatitis   . Diabetes mellitus, type II ([place])   . Elevated PSA   . Hyperlipemia   . Hyperlipidemia   . Hypertension   . Insomnia   . Insomnia   . OSA on CPAP   . Prostate nodule   . Pruritus   . Stable central retinal vein occlusion    Past Surgical History:  Procedure Laterality Date  . COLONOSCOPY WITH PROPOFOL N/A 03/21/2016   Procedure: COLONOSCOPY WITH PROPOFOL;  Surgeon: Manya Silvas, MD;  Location: [facility] ENDOSCOPY;  Service: Endoscopy;  Laterality: N/A;  . COLONOSCOPY WITH PROPOFOL N/A 11/13/2016   Procedure: COLONOSCOPY WITH PROPOFOL;  Surgeon: Manya Silvas, MD;  Location: [facility] ENDOSCOPY;  Service: Endoscopy;  Laterality: N/A;  . NO PAST SURGERIES     Family History  Problem Relation Age of Onset  . Prostate cancer Brother        Father  . Diabetes Brother   . Colon cancer Brother   . Pancreatic cancer Sister   . Lung cancer Sister   . Coronary artery disease Father   . Heart disease Father   . Congestive Heart Failure Mother   . Diabetes Mother    Social History   Socioeconomic History  . Marital status: Married    Spouse name: Not on file  . Number of children: 1  . Years of education: Not  on file  . Highest education level: High school graduate  Occupational History  . Occupation: retired  Scientific laboratory technician  . Financial resource strain: Not hard at all  . Food insecurity    Worry: Never true    Inability: Never true  . Transportation needs    Medical: No    Non-medical: No  Tobacco Use  . Smoking status: Former Smoker    Years: 14.00    Types: Cigarettes    Quit date: 07/08/1973    Years since quitting: 45.7  . Smokeless tobacco: Never Used  Substance and Sexual Activity  . Alcohol use: No    Alcohol/week: 0.0 standard drinks  . Drug use: No  . Sexual activity: Not on file  Lifestyle  . Physical activity    Days per week: 0 days    Minutes per session: 0 min  . Stress: Not at all  Relationships  . Social Herbalist on phone: Patient refused    Gets together: Patient refused    Attends religious service: Patient refused    Active member of club or organization: Patient refused    Attends meetings of clubs or organizations: Patient refused    Relationship status: Patient refused  Other Topics Concern  . Not on file  Social History Narrative  . Not on file    Outpatient Encounter Medications as of 03/29/2019  Medication Sig  . amLODipine (NORVASC) 10 MG tablet TAKE 1 TABLET(10 MG) BY MOUTH DAILY  . aspirin 81 MG EC tablet Take 81 mg by mouth daily. Swallow whole.  Marland Kitchen atorvastatin (LIPITOR) 20 MG tablet TAKE 1 TABLET(20 MG) BY MOUTH DAILY  . benazepril (LOTENSIN) 40 MG tablet TAKE 1 TABLET BY MOUTH EVERY DAY  . glucose blood test strip Use as instructed  . latanoprost (XALATAN) 0.005 % ophthalmic solution Place 1 drop into both eyes at bedtime.  . metFORMIN (GLUCOPHAGE) 1000 MG tablet Take 1 tablet (1,000 mg total) by mouth 2 (two) times daily.  . pioglitazone (ACTOS) 45 MG tablet TAKE 1 TABLET(45 MG) BY MOUTH DAILY  . amLODipine (NORVASC) 10 MG tablet TAKE 1 TABLET(10 MG) BY MOUTH DAILY (Patient not taking: Reported on 03/29/2019)   No  facility-administered encounter medications on file as of 03/29/2019.     Activities of Daily Living In your present state of health, do you have any difficulty performing the following activities: 03/29/2019  Hearing? N  Vision? N  Comment Wears eye glasses daily.  Difficulty concentrating or making decisions? N  Walking or climbing stairs? N  Dressing or bathing? N  Doing errands, shopping? N  Preparing Food and eating ? N  Using the Toilet? N  In the past six months, have you accidently leaked urine? N  Do you have problems with loss  of bowel control? N  Managing your Medications? N  Managing your Finances? N  Housekeeping or managing your Housekeeping? N  Some recent data might be hidden    Patient Care Team: Jerrol Banana., MD as PCP - General (Family Medicine) Hollice Espy, MD as Consulting Physician (Urology) Eulogio Bear, MD as Consulting Physician (Ophthalmology)   Assessment:   This is a routine wellness examination for [place].  Exercise Activities and Dietary recommendations Current Exercise Habits: The patient does not participate in regular exercise at present, Exercise limited by: None identified  Goals    . Increase water intake     Recommend increasing water intake to 4 glasses a day.       Fall Risk Fall Risk  03/29/2019 08/27/2018 03/26/2018 10/24/2016 10/24/2015  Falls in the past year? 0 0 No No No   FALL RISK PREVENTION PERTAINING TO THE HOME:  Any stairs in or around the home? Yes  If so, are there any without handrails? No   Home free of loose throw rugs in walkways, pet beds, electrical cords, etc? Yes  Adequate lighting in your home to reduce risk of falls? Yes   ASSISTIVE DEVICES UTILIZED TO PREVENT FALLS:  Life alert? No  Use of a cane, walker or w/c? No  Grab bars in the bathroom? No  Shower chair or bench in shower? No  Elevated toilet seat or a handicapped toilet? No    TIMED UP AND GO:  Was the test performed? No .     Depression Screen PHQ 2/9 Scores 03/29/2019 08/27/2018 03/26/2018 03/26/2018  PHQ - 2 Score 0 0 0 0  PHQ- 9 Score - - 0 -    Cognitive Function: Declined today.      6CIT Screen 10/24/2016  What Year? 0 points  What month? 0 points  What time? 0 points  Count back from 20 0 points  Months in reverse 0 points  Repeat phrase 2 points  Total Score 2    Immunization History  Administered Date(s) Administered  . Influenza Split 05/21/2011  . Influenza, High Dose Seasonal PF 04/25/2014, 03/26/2018  . Influenza,inj,Quad PF,6+ Mos 04/07/2013  . Influenza-Unspecified 04/15/2017  . Pneumococcal Conjugate-13 12/14/2013  . Pneumococcal Polysaccharide-23 02/28/2011  . Td 07/27/2003, 02/13/2015    Qualifies for Shingles Vaccine? Yes . Due for Shingrix. Education has been provided regarding the importance of this vaccine. Pt has been advised to call insurance company to determine out of pocket expense. Advised may also receive vaccine at local pharmacy or Health Dept. Verbalized acceptance and understanding.  Tdap: Up to date  Flu Vaccine: Due for Flu vaccine. Does the patient want to receive this vaccine today?  No .   Pneumococcal Vaccine: Completed series  Screening Tests Health Maintenance  Topic Date Due  . FOOT EXAM  12/30/2017  . INFLUENZA VACCINE  02/06/2019  . HEMOGLOBIN A1C  02/25/2019  . OPHTHALMOLOGY EXAM  05/27/2019  . COLONOSCOPY  11/13/2021  . TETANUS/TDAP  02/12/2025  . Hepatitis C Screening  Completed  . PNA vac Low Risk Adult  Completed   Cancer Screenings:  Colorectal Screening: Completed 11/13/16. Repeat every 5 years.  Lung Cancer Screening: (Low Dose CT Chest recommended if Age 40-80 years, 30 pack-year currently smoking OR have quit w/in 15years.) does not qualify.   Additional Screening:  Hepatitis C Screening: Up to date  Dental Screening: Recommended annual dental exams for proper oral hygiene  Community Resource Referral:  CRR required this  visit?  No        Plan:  I have personally reviewed and addressed the Medicare Annual Wellness questionnaire and have noted the following in the patient's chart:  A. Medical and social history B. Use of alcohol, tobacco or illicit drugs  C. Current medications and supplements D. Functional ability and status E.  Nutritional status F.  Physical activity G. Advance directives H. List of other physicians I.  Hospitalizations, surgeries, and ER visits in previous 12 months J.  [place] such as hearing and vision if needed, cognitive and depression L. Referrals and appointments   In addition, I have reviewed and discussed with patient certain preventive protocols, quality metrics, and best practice recommendations. A written personalized care plan for preventive services as well as general preventive health recommendations were provided to patient.   [place] Score, [place]  [id number] Nurse Health Advisor  Nurse Notes: Pt needs a diabetic foot exam, Hgb A1c check and influenza vaccine at in office visit tomorrow.

## 2019-03-29 ENCOUNTER — Ambulatory Visit (INDEPENDENT_AMBULATORY_CARE_PROVIDER_SITE_OTHER): Payer: PPO

## 2019-03-29 ENCOUNTER — Other Ambulatory Visit: Payer: Self-pay

## 2019-03-29 DIAGNOSIS — Z0000 Encounter for general adult medical examination without abnormal findings: Secondary | ICD-10-CM | POA: Diagnosis not present

## 2019-03-29 NOTE — Patient Instructions (Signed)
Mr. Juan Mack , Thank you for taking time to come for your Medicare Wellness Visit. I appreciate your ongoing commitment to your health goals. Please review the following plan we discussed and let me know if I can assist you in the future.   Screening recommendations/referrals: Colonoscopy: Up to date, due 11/2021 Recommended yearly ophthalmology/optometry visit for glaucoma screening and checkup Recommended yearly dental visit for hygiene and checkup  Vaccinations: Influenza vaccine: Currently due  Pneumococcal vaccine: Completed series Tdap vaccine: Up to date, due 02/2025 Shingles vaccine: Pt declines today.     Advanced directives: Advance directive discussed with you today. Even though you declined this today please call our office should you change your mind and we can give you the proper paperwork for you to fill out.  Conditions/risks identified: Continue to increase water intake to 6-8 8 oz glasses a day.   Next appointment: 03/30/19 @ 10:00 AM with Dr Rosanna Randy.   Preventive Care 74 Years and Older, Male Preventive care refers to lifestyle choices and visits with your health care provider that can promote health and wellness. What does preventive care include?  A yearly physical exam. This is also called an annual well check.  Dental exams once or twice a year.  Routine eye exams. Ask your health care provider how often you should have your eyes checked.  Personal lifestyle choices, including:  Daily care of your teeth and gums.  Regular physical activity.  Eating a healthy diet.  Avoiding tobacco and drug use.  Limiting alcohol use.  Practicing safe sex.  Taking low doses of aspirin every day.  Taking vitamin and mineral supplements as recommended by your health care provider. What happens during an annual well check? The services and screenings done by your health care provider during your annual well check will depend on your age, overall health, lifestyle risk  factors, and family history of disease. Counseling  Your health care provider may ask you questions about your:  Alcohol use.  Tobacco use.  Drug use.  Emotional well-being.  Home and relationship well-being.  Sexual activity.  Eating habits.  History of falls.  Memory and ability to understand (cognition).  Work and work Statistician. Screening  You may have the following tests or measurements:  Height, weight, and BMI.  Blood pressure.  Lipid and cholesterol levels. These may be checked every 5 years, or more frequently if you are over 29 years old.  Skin check.  Lung cancer screening. You may have this screening every year starting at age 45 if you have a 30-pack-year history of smoking and currently smoke or have quit within the past 15 years.  Fecal occult blood test (FOBT) of the stool. You may have this test every year starting at age 46.  Flexible sigmoidoscopy or colonoscopy. You may have a sigmoidoscopy every 5 years or a colonoscopy every 10 years starting at age 46.  Prostate cancer screening. Recommendations will vary depending on your family history and other risks.  Hepatitis C blood test.  Hepatitis B blood test.  Sexually transmitted disease (STD) testing.  Diabetes screening. This is done by checking your blood sugar (glucose) after you have not eaten for a while (fasting). You may have this done every 1-3 years.  Abdominal aortic aneurysm (AAA) screening. You may need this if you are a current or former smoker.  Osteoporosis. You may be screened starting at age 41 if you are at high risk. Talk with your health care provider about your test results,  treatment options, and if necessary, the need for more tests. Vaccines  Your health care provider may recommend certain vaccines, such as:  Influenza vaccine. This is recommended every year.  Tetanus, diphtheria, and acellular pertussis (Tdap, Td) vaccine. You may need a Td booster every 10  years.  Zoster vaccine. You may need this after age 20.  Pneumococcal 13-valent conjugate (PCV13) vaccine. One dose is recommended after age 16.  Pneumococcal polysaccharide (PPSV23) vaccine. One dose is recommended after age 7. Talk to your health care provider about which screenings and vaccines you need and how often you need them. This information is not intended to replace advice given to you by your health care provider. Make sure you discuss any questions you have with your health care provider. Document Released: 07/21/2015 Document Revised: 03/13/2016 Document Reviewed: 04/25/2015 Elsevier Interactive Patient Education  2017 [place] Prevention in the Home Falls can cause injuries. They can happen to people of all ages. There are many things you can do to make your home safe and to help prevent falls. What can I do on the outside of my home?  Regularly fix the edges of walkways and driveways and fix any cracks.  Remove anything that might make you trip as you walk through a door, such as a raised step or threshold.  Trim any bushes or trees on the path to your home.  Use bright outdoor lighting.  Clear any walking paths of anything that might make someone trip, such as rocks or tools.  Regularly check to see if handrails are loose or broken. Make sure that both sides of any steps have handrails.  Any raised decks and porches should have guardrails on the edges.  Have any leaves, snow, or ice cleared regularly.  Use sand or salt on walking paths during winter.  Clean up any spills in your garage right away. This includes oil or grease spills. What can I do in the bathroom?  Use night lights.  Install grab bars by the toilet and in the tub and shower. Do not use towel bars as grab bars.  Use non-skid mats or decals in the tub or shower.  If you need to sit down in the shower, use a plastic, non-slip stool.  Keep the floor dry. Clean up any water that  spills on the floor as soon as it happens.  Remove soap buildup in the tub or shower regularly.  Attach bath mats securely with double-sided non-slip rug tape.  Do not have throw rugs and other things on the floor that can make you trip. What can I do in the bedroom?  Use night lights.  Make sure that you have a light by your bed that is easy to reach.  Do not use any sheets or blankets that are too big for your bed. They should not hang down onto the floor.  Have a firm chair that has side arms. You can use this for support while you get dressed.  Do not have throw rugs and other things on the floor that can make you trip. What can I do in the kitchen?  Clean up any spills right away.  Avoid walking on wet floors.  Keep items that you use a lot in easy-to-reach places.  If you need to reach something above you, use a strong step stool that has a grab bar.  Keep electrical cords out of the way.  Do not use floor polish or wax that makes floors  slippery. If you must use wax, use non-skid floor wax.  Do not have throw rugs and other things on the floor that can make you trip. What can I do with my stairs?  Do not leave any items on the stairs.  Make sure that there are handrails on both sides of the stairs and use them. Fix handrails that are broken or loose. Make sure that handrails are as long as the stairways.  Check any carpeting to make sure that it is firmly attached to the stairs. Fix any carpet that is loose or worn.  Avoid having throw rugs at the top or bottom of the stairs. If you do have throw rugs, attach them to the floor with carpet tape.  Make sure that you have a light switch at the top of the stairs and the bottom of the stairs. If you do not have them, ask someone to add them for you. What else can I do to help prevent falls?  Wear shoes that:  Do not have high heels.  Have rubber bottoms.  Are comfortable and fit you well.  Are closed at the  toe. Do not wear sandals.  If you use a stepladder:  Make sure that it is fully opened. Do not climb a closed stepladder.  Make sure that both sides of the stepladder are locked into place.  Ask someone to hold it for you, if possible.  Clearly mark and make sure that you can see:  Any grab bars or handrails.  First and last steps.  Where the edge of each step is.  Use tools that help you move around (mobility aids) if they are needed. These include:  Canes.  Walkers.  Scooters.  Crutches.  Turn on the lights when you go into a dark area. Replace any light bulbs as soon as they burn out.  Set up your furniture so you have a clear path. Avoid moving your furniture around.  If any of your floors are uneven, fix them.  If there are any pets around you, be aware of where they are.  Review your medicines with your doctor. Some medicines can make you feel dizzy. This can increase your chance of falling. Ask your doctor what other things that you can do to help prevent falls. This information is not intended to replace advice given to you by your health care provider. Make sure you discuss any questions you have with your health care provider. Document Released: 04/20/2009 Document Revised: 11/30/2015 Document Reviewed: 07/29/2014 Elsevier Interactive Patient Education  2017 [employer].

## 2019-03-30 ENCOUNTER — Ambulatory Visit (INDEPENDENT_AMBULATORY_CARE_PROVIDER_SITE_OTHER): Payer: PPO | Admitting: Family Medicine

## 2019-03-30 ENCOUNTER — Ambulatory Visit: Payer: PPO

## 2019-03-30 ENCOUNTER — Encounter: Payer: Self-pay | Admitting: Family Medicine

## 2019-03-30 VITALS — BP 132/68 | HR 68 | Temp 98.8°F | Resp 16 | Ht 71.0 in | Wt 205.0 lb

## 2019-03-30 DIAGNOSIS — I10 Essential (primary) hypertension: Secondary | ICD-10-CM

## 2019-03-30 DIAGNOSIS — G4733 Obstructive sleep apnea (adult) (pediatric): Secondary | ICD-10-CM | POA: Diagnosis not present

## 2019-03-30 DIAGNOSIS — E7800 Pure hypercholesterolemia, unspecified: Secondary | ICD-10-CM

## 2019-03-30 DIAGNOSIS — Z0000 Encounter for general adult medical examination without abnormal findings: Secondary | ICD-10-CM

## 2019-03-30 DIAGNOSIS — Z23 Encounter for immunization: Secondary | ICD-10-CM

## 2019-03-30 DIAGNOSIS — C61 Malignant neoplasm of prostate: Secondary | ICD-10-CM | POA: Diagnosis not present

## 2019-03-30 DIAGNOSIS — E119 Type 2 diabetes mellitus without complications: Secondary | ICD-10-CM | POA: Diagnosis not present

## 2019-03-30 DIAGNOSIS — H401231 Low-tension glaucoma, bilateral, mild stage: Secondary | ICD-10-CM

## 2019-03-30 LAB — POCT UA - MICROALBUMIN: Microalbumin Ur, POC: 50 mg/L

## 2019-03-30 NOTE — Progress Notes (Signed)
Patient: Juan Mack, Male    DOB: 23-Jul-1945, 73 y.o.   MRN: [id number] Visit Date: 03/30/2019  Today's Provider: Wilhemena Durie, MD   Chief Complaint  Patient presents with  . Annual Exam   Subjective:   Patient had AWE on 03/29/2019.    Annual physical exam Juan Mack is a 73 y.o. male who presents today for health maintenance and complete physical. He feels well. He reports exercising not regularly, but he does stay active. He reports he is sleeping well.  Colonoscopy- 11/13/2016. Dr. Vira Agar. History of polyps. Repeat in 5 yrs.   Immunization History  Administered Date(s) Administered  . Fluad Quad(high Dose 65+) 03/30/2019  . Influenza Split 05/21/2011  . Influenza, High Dose Seasonal PF 04/25/2014, 03/26/2018  . Influenza,inj,Quad PF,6+ Mos 04/07/2013  . Influenza-Unspecified 04/15/2017  . Pneumococcal Conjugate-13 12/14/2013  . Pneumococcal Polysaccharide-23 02/28/2011  . Td 07/27/2003, 02/13/2015    Review of Systems  Constitutional: Negative.   HENT: Negative.   Eyes: Negative.   Respiratory: Negative.   Cardiovascular: Negative.   Gastrointestinal: Negative.   Endocrine: Negative.   Genitourinary: Negative.   Musculoskeletal: Negative.   Skin: Negative.   Allergic/Immunologic: Negative.   Neurological: Negative.   Hematological: Negative.   Psychiatric/Behavioral: Negative.     Social History      He  reports that he quit smoking about 45 years ago. His smoking use included cigarettes. He quit after 14.00 years of use. He has never used smokeless tobacco. He reports that he does not drink alcohol or use drugs.       Social History   Socioeconomic History  . Marital status: Married    Spouse name: Not on file  . Number of children: 1  . Years of education: Not on file  . Highest education level: High school graduate  Occupational History  . Occupation: retired  Scientific laboratory technician  . Financial resource strain: Not hard at all  . Food  insecurity    Worry: Never true    Inability: Never true  . Transportation needs    Medical: No    Non-medical: No  Tobacco Use  . Smoking status: Former Smoker    Years: 14.00    Types: Cigarettes    Quit date: 07/08/1973    Years since quitting: 45.7  . Smokeless tobacco: Never Used  Substance and Sexual Activity  . Alcohol use: No    Alcohol/week: 0.0 standard drinks  . Drug use: No  . Sexual activity: Not on file  Lifestyle  . Physical activity    Days per week: 0 days    Minutes per session: 0 min  . Stress: Not at all  Relationships  . Social Herbalist on phone: Patient refused    Gets together: Patient refused    Attends religious service: Patient refused    Active member of club or organization: Patient refused    Attends meetings of clubs or organizations: Patient refused    Relationship status: Patient refused  Other Topics Concern  . Not on file  Social History Narrative  . Not on file    Past Medical History:  Diagnosis Date  . Anxiety   . Anxiety disorder   . BCC (basal cell carcinoma)   . Central vein occlusion of retina   . Dermatitis   . Diabetes mellitus, type II ([place])   . Elevated PSA   . Hyperlipemia   . Hyperlipidemia   .  Hypertension   . Insomnia   . Insomnia   . OSA on CPAP   . Prostate nodule   . Pruritus   . Stable central retinal vein occlusion      Patient Active Problem List   Diagnosis Date Noted  . Basal cell carcinoma of skin 12/01/2014  . 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 Special screening for malignant neoplasms, colon 12/01/2014  . Dermatitis, eczematoid 12/01/2014  . Polypharmacy 12/01/2014  . Essential (primary) hypertension 12/01/2014  . Anxiety, generalized 12/01/2014  . Flu vaccine need 12/01/2014  . HLD (hyperlipidemia) 12/01/2014  . Cannot sleep 12/01/2014  . Pruritic disorder 12/01/2014  . Obstructive apnea 12/01/2014  . Special screening for malignant neoplasm of prostate 12/01/2014   . Prostate lump 12/01/2014  . Scabies 12/01/2014  . Diabetes mellitus, type 2 ([place]) 12/01/2014    Past Surgical History:  Procedure Laterality Date  . COLONOSCOPY WITH PROPOFOL N/A 03/21/2016   Procedure: COLONOSCOPY WITH PROPOFOL;  Surgeon: Manya Silvas, MD;  Location: [facility] ENDOSCOPY;  Service: Endoscopy;  Laterality: N/A;  . COLONOSCOPY WITH PROPOFOL N/A 11/13/2016   Procedure: COLONOSCOPY WITH PROPOFOL;  Surgeon: Manya Silvas, MD;  Location: [facility] ENDOSCOPY;  Service: Endoscopy;  Laterality: N/A;  . NO PAST SURGERIES      Family History        Family Status  Relation Name Status  . Brother  Alive  . Brother  Deceased at age 77  . Sister  Deceased       in her 39s  . Sister  Deceased at age 66  . Father  Deceased at age 69  . Mother  Deceased at age 12        His family history includes Colon cancer in his brother; Congestive Heart Failure in his mother; Coronary artery disease in his father; Diabetes in his brother and mother; Heart disease in his father; Lung cancer in his sister; Pancreatic cancer in his sister; Prostate cancer in his brother.      No Known Allergies   Current Outpatient Medications:  .  amLODipine (NORVASC) 10 MG tablet, TAKE 1 TABLET(10 MG) BY MOUTH DAILY, Disp: 90 tablet, Rfl: 3 .  aspirin 81 MG EC tablet, Take 81 mg by mouth daily. Swallow whole., Disp: , Rfl:  .  atorvastatin (LIPITOR) 20 MG tablet, TAKE 1 TABLET(20 MG) BY MOUTH DAILY, Disp: 90 tablet, Rfl: 3 .  benazepril (LOTENSIN) 40 MG tablet, TAKE 1 TABLET BY MOUTH EVERY DAY, Disp: 30 tablet, Rfl: 11 .  glucose blood test strip, Use as instructed, Disp: 100 each, Rfl: 12 .  latanoprost (XALATAN) 0.005 % ophthalmic solution, Place 1 drop into both eyes at bedtime., Disp: , Rfl:  .  metFORMIN (GLUCOPHAGE) 1000 MG tablet, Take 1 tablet (1,000 mg total) by mouth 2 (two) times daily., Disp: 180 tablet, Rfl: 3 .  pioglitazone (ACTOS) 45 MG tablet, TAKE 1 TABLET(45 MG) BY MOUTH DAILY, Disp: 90  tablet, Rfl: 3 .  amLODipine (NORVASC) 10 MG tablet, TAKE 1 TABLET(10 MG) BY MOUTH DAILY (Patient not taking: Reported on 03/29/2019), Disp: 90 tablet, Rfl: 3   Patient Care Team: Jerrol Banana., MD as PCP - General (Family Medicine) Hollice Espy, MD as Consulting Physician (Urology) Eulogio Bear, MD as Consulting Physician (Ophthalmology)    Objective:    Vitals: BP 132/68   Pulse 68   Temp 98.8 F (37.1 C)   Resp 16   Ht 5\' 11"  (1.803 m)  Wt 205 lb (93 kg)   SpO2 100%   BMI 28.59 kg/m    Vitals:   03/30/19 1004  BP: 132/68  Pulse: 68  Resp: 16  Temp: 98.8 F (37.1 C)  SpO2: 100%  Weight: 205 lb (93 kg)  Height: 5\' 11"  (1.803 m)     Physical Exam Vitals signs reviewed.  Constitutional:      Appearance: He is well-developed.  HENT:     Head: Normocephalic and atraumatic.     Right Ear: External ear normal.     Left Ear: External ear normal.     Nose: Nose normal.  Eyes:     Conjunctiva/sclera: Conjunctivae normal.     Pupils: Pupils are equal, round, and reactive to light.  Neck:     Musculoskeletal: Normal range of motion and neck supple.  Cardiovascular:     Rate and Rhythm: Normal rate and regular rhythm.     Heart sounds: Normal heart sounds.  Pulmonary:     Effort: Pulmonary effort is normal.     Breath sounds: Normal breath sounds.  Abdominal:     General: Bowel sounds are normal.     Palpations: Abdomen is soft.  Genitourinary:    Penis: Normal.      Scrotum/Testes: Normal.  Musculoskeletal: Normal range of motion.  Skin:    General: Skin is warm and dry.     Comments: Mild senile purpura  Neurological:     General: No focal deficit present.     Mental Status: He is alert and oriented to person, place, and time.     Comments: Foot exam WNL.  Psychiatric:        Mood and Affect: Mood normal.        Behavior: Behavior normal.        Thought Content: Thought content normal.        Judgment: Judgment normal.       Depression Screen PHQ 2/9 Scores 03/29/2019 08/27/2018 03/26/2018 03/26/2018  PHQ - 2 Score 0 0 0 0  PHQ- 9 Score - - 0 -       Assessment & Plan:     Routine Health Maintenance and Physical Exam  Exercise Activities and Dietary recommendations Goals    . Increase water intake     Recommend increasing water intake to 4 glasses a day.       Health Maintenance  Topic Date Due  . FOOT EXAM  12/30/2017  . INFLUENZA VACCINE  02/06/2019  . HEMOGLOBIN A1C  02/25/2019  . OPHTHALMOLOGY EXAM  05/27/2019  . COLONOSCOPY  11/13/2021  . TETANUS/TDAP  02/12/2025  . Hepatitis C Screening  Completed  . PNA vac Low Risk Adult  Completed     Discussed health benefits of physical activity, and encouraged him to engage in regular exercise appropriate for his age and condition.  1. Annual physical exam   2. Type 2 diabetes mellitus without complication, without long-term current use of insulin (HCC) On metformin and Actos. - Hemoglobin A1c - POCT UA - Microalbumin  3. Essential (primary) hypertension On amlodipine and benazepril. - CBC with Differential/Platelet - Comprehensive metabolic panel  4. Pure hypercholesterolemia On atorvastatin - Lipid panel - TSH  5. Need for immunization against influenza  - Flu Vaccine QUAD High Dose(Fluad)  6. Obstructive apnea   7. Prostate cancer [facility]) Being  treated by urology.  8. Low-tension glaucoma of both eyes, mild stage Per ophthalmology.       Wilhemena Durie, MD  [facility]

## 2019-03-31 LAB — LIPID PANEL
Chol/HDL Ratio: 3.6 ratio (ref 0.0–5.0)
Cholesterol, Total: 116 mg/dL (ref 100–199)
HDL: 32 mg/dL — ABNORMAL LOW (ref >39)
LDL Chol Calc (NIH): 58 mg/dL (ref 0–99)
Triglycerides: 151 mg/dL — ABNORMAL HIGH (ref 0–149)
VLDL Cholesterol Cal: 26 mg/dL (ref 5–40)

## 2019-03-31 LAB — COMPREHENSIVE METABOLIC PANEL
ALT: 12 IU/L (ref 0–44)
AST: 13 IU/L (ref 0–40)
Albumin/Globulin Ratio: 1.4 (ref 1.2–2.2)
Albumin: 4.2 g/dL (ref 3.7–4.7)
Alkaline Phosphatase: 55 IU/L (ref 39–117)
BUN/Creatinine Ratio: 12 (ref 10–24)
BUN: 14 mg/dL (ref 8–27)
Bilirubin Total: 0.7 mg/dL (ref 0.0–1.2)
CO2: 21 mmol/L (ref 20–29)
Calcium: 9.8 mg/dL (ref 8.6–10.2)
Chloride: 100 mmol/L (ref 96–106)
Creatinine, Ser: 1.2 mg/dL (ref 0.76–1.27)
GFR calc Af Amer: 69 mL/min/{1.73_m2} (ref >59)
GFR calc non Af Amer: 60 mL/min/{1.73_m2} (ref >59)
Globulin, Total: 2.9 g/dL (ref 1.5–4.5)
Glucose: 131 mg/dL — ABNORMAL HIGH (ref 65–99)
Potassium: 4.8 mmol/L (ref 3.5–5.2)
Sodium: 137 mmol/L (ref 134–144)
Total Protein: 7.1 g/dL (ref 6.0–8.5)

## 2019-03-31 LAB — CBC WITH DIFFERENTIAL/PLATELET
Basophils Absolute: 0.1 10*3/uL (ref 0.0–0.2)
Basos: 1 %
EOS (ABSOLUTE): 0.2 10*3/uL (ref 0.0–0.4)
Eos: 2 %
Hematocrit: 36.2 % — ABNORMAL LOW (ref 37.5–51.0)
Hemoglobin: 12 g/dL — ABNORMAL LOW (ref 13.0–17.7)
Immature Grans (Abs): 0 10*3/uL (ref 0.0–0.1)
Immature Granulocytes: 0 %
Lymphocytes Absolute: 2.2 10*3/uL (ref 0.7–3.1)
Lymphs: 30 %
MCH: 31.4 pg (ref 26.6–33.0)
MCHC: 33.1 g/dL (ref 31.5–35.7)
MCV: 95 fL (ref 79–97)
Monocytes Absolute: 0.7 10*3/uL (ref 0.1–0.9)
Monocytes: 9 %
Neutrophils Absolute: 4.2 10*3/uL (ref 1.4–7.0)
Neutrophils: 58 %
Platelets: 259 10*3/uL (ref 150–450)
RBC: 3.82 x10E6/uL — ABNORMAL LOW (ref 4.14–5.80)
RDW: 13.4 % (ref 11.6–15.4)
WBC: 7.3 10*3/uL (ref 3.4–10.8)

## 2019-03-31 LAB — HEMOGLOBIN A1C
Est. average glucose Bld gHb Est-mCnc: 154 mg/dL
Hgb A1c MFr Bld: 7 % — ABNORMAL HIGH (ref 4.8–5.6)

## 2019-03-31 LAB — TSH: TSH: 1.85 u[IU]/mL (ref 0.450–4.500)

## 2019-05-16 ENCOUNTER — Other Ambulatory Visit: Payer: Self-pay | Admitting: Family Medicine

## 2019-05-19 DIAGNOSIS — H401131 Primary open-angle glaucoma, bilateral, mild stage: Secondary | ICD-10-CM | POA: Diagnosis not present

## 2019-05-27 ENCOUNTER — Encounter: Payer: Self-pay | Admitting: *Deleted

## 2019-06-02 DIAGNOSIS — H401131 Primary open-angle glaucoma, bilateral, mild stage: Secondary | ICD-10-CM | POA: Diagnosis not present

## 2019-06-02 DIAGNOSIS — H2513 Age-related nuclear cataract, bilateral: Secondary | ICD-10-CM | POA: Diagnosis not present

## 2019-06-02 LAB — HM DIABETES EYE EXAM

## 2019-07-19 DIAGNOSIS — Z20828 Contact with and (suspected) exposure to other viral communicable diseases: Secondary | ICD-10-CM | POA: Diagnosis not present

## 2019-08-27 ENCOUNTER — Other Ambulatory Visit: Payer: Self-pay

## 2019-08-27 ENCOUNTER — Other Ambulatory Visit: Payer: PPO

## 2019-08-27 DIAGNOSIS — C61 Malignant neoplasm of prostate: Secondary | ICD-10-CM | POA: Diagnosis not present

## 2019-08-28 LAB — PSA: Prostate Specific Ag, Serum: 2.5 ng/mL (ref 0.0–4.0)

## 2019-08-29 ENCOUNTER — Other Ambulatory Visit: Payer: Self-pay | Admitting: Family Medicine

## 2019-08-29 DIAGNOSIS — E119 Type 2 diabetes mellitus without complications: Secondary | ICD-10-CM

## 2019-09-01 ENCOUNTER — Telehealth: Payer: Self-pay | Admitting: Family Medicine

## 2019-09-01 DIAGNOSIS — E119 Type 2 diabetes mellitus without complications: Secondary | ICD-10-CM

## 2019-09-01 MED ORDER — BLOOD GLUCOSE METER KIT: PACK | 0 refills | Status: DC

## 2019-09-01 NOTE — Telephone Encounter (Signed)
Pt is requesting rx for new glucose meter and test strips. Stated he used One Touch but does not have a brand preference.  [facility] [place], [place] AT [facility] OF [address] [facility] Phone:  [phone]  Fax:  [phone]

## 2019-09-01 NOTE — Telephone Encounter (Signed)
Rx was faxed to [facility].

## 2019-09-02 ENCOUNTER — Ambulatory Visit: Payer: PPO | Admitting: Urology

## 2019-09-06 NOTE — Progress Notes (Signed)
09/07/2019 7:26 PM   Juan Mack 11-26-1945 [id number]  Referring provider: Jerrol Banana., MD [address] [place],  [place] [address]  Chief Complaint  Patient presents with  . Prostate Cancer    1year w/PSA    HPI: Juan Mack is a 74 yo male who returns today for a 1 year f/u for the evaluation and management of low risk prostate cancer.   Diagnosed 11/2014 with low risk prostate cancer. Gleason score 6 (3+3) in the right mid lateral 1/2 biopsies 3% with 4.0 PSA.   Endorectal MRI in 01/2016 was unremarkable.  Currently on active surveillance. PSA remained stable around 2-2.5 ng/dL. PSA slightly elevated at 3.0 on 08/25/2018. Most recent PSA decreased to 2.5 on 08/27/2019.   Patient reports no bothersome urinary symptoms at this time.  Overall pleased.  Patient has a history of Type 2 DM, controlled (HGBA1C 7.3). Managed by PCP.  PSA Trend 2.4 on 06/12/2015 2.3 on 12/15/2015 2.0 on 06/25/2016 2.2 on 12/24/2016 2.8 on 07/17/2017 2.4 on 01/19/2018 3.0 on 08/25/2018 2.5 on 08/27/2019  PMH: Past Medical History:  Diagnosis Date  . Anxiety   . Anxiety disorder   . BCC (basal cell carcinoma)   . Central vein occlusion of retina   . Dermatitis   . Diabetes mellitus, type II ([place])   . Elevated PSA   . Hyperlipemia   . Hyperlipidemia   . Hypertension   . Insomnia   . Insomnia   . OSA on CPAP   . Prostate nodule   . Pruritus   . Stable central retinal vein occlusion     Surgical History: Past Surgical History:  Procedure Laterality Date  . COLONOSCOPY WITH PROPOFOL N/A 03/21/2016   Procedure: COLONOSCOPY WITH PROPOFOL;  Surgeon: Manya Silvas, MD;  Location: [facility] ENDOSCOPY;  Service: Endoscopy;  Laterality: N/A;  . COLONOSCOPY WITH PROPOFOL N/A 11/13/2016   Procedure: COLONOSCOPY WITH PROPOFOL;  Surgeon: Manya Silvas, MD;  Location[facility] ENDOSCOPY;  Service: Endoscopy;  Laterality: N/A;  . NO PAST SURGERIES      Home Medications:   Allergies as of 09/07/2019   No Known Allergies     Medication List       Accurate as of September 07, 2019  7:26 PM. If you have any questions, ask your nurse or doctor.        amLODipine 10 MG tablet Commonly known as: NORVASC TAKE 1 TABLET(10 MG) BY MOUTH DAILY What changed: Another medication with the same name was removed. Continue taking this medication, and follow the directions you see here. Changed by: Hollice Espy, MD   aspirin 81 MG EC tablet Take 81 mg by mouth daily. Swallow whole.   atorvastatin 20 MG tablet Commonly known as: LIPITOR TAKE 1 TABLET(20 MG) BY MOUTH DAILY   benazepril 40 MG tablet Commonly known as: LOTENSIN TAKE 1 TABLET BY MOUTH EVERY DAY   blood glucose meter kit and supplies Dispense based on patient and insurance preference. Use once time daily as directed. (FOR ICD-E11.9).   glucose blood test strip Use as instructed   latanoprost 0.005 % ophthalmic solution Commonly known as: XALATAN Place 1 drop into both eyes at bedtime.   metFORMIN 1000 MG tablet Commonly known as: GLUCOPHAGE Take 1 tablet (1,000 mg total) by mouth 2 (two) times daily.   pioglitazone 45 MG tablet Commonly known as: ACTOS TAKE 1 TABLET(45 MG) BY MOUTH DAILY       Allergies: No Known  Allergies  Family History: Family History  Problem Relation Age of Onset  . Prostate cancer Brother        Father  . Diabetes Brother   . Colon cancer Brother   . Pancreatic cancer Sister   . Lung cancer Sister   . Coronary artery disease Father   . Heart disease Father   . Congestive Heart Failure Mother   . Diabetes Mother     Social History:  reports that he quit smoking about 46 years ago. His smoking use included cigarettes. He quit after 14.00 years of use. He has never used smokeless tobacco. He reports that he does not drink alcohol or use drugs.   Physical Exam: BP (!) 165/69   Pulse 77   Ht 5' 10" (1.778 m)   Wt 202 lb (91.6 kg)   BMI 28.98 kg/m    Constitutional:  Alert and oriented, No acute distress. HEENT: [place] AT, moist mucus membranes.  Trachea midline, no masses. Cardiovascular: No clubbing, cyanosis, or edema. Respiratory: Normal respiratory effort, no increased work of breathing. Rectal: 50 g prostate, no masses, tenderness or lesion..  Skin: No rashes, bruises or suspicious lesions. Neurologic: Grossly intact, no focal deficits, moving all 4 extremities. Psychiatric: Normal mood and affect.  Laboratory Data: PSA as above   Assessment & Plan:    1. Prostate Cancer  PSA stable  Pt has no bothersome urinary symptoms  Prostate MRI has no suspicious lesions which is expected in low risk prostate cancer 09/2018 PSA only in 6 months F/u with PSA only in 6 months and PSA/DRE in 1 year  2. Elevated PSA See above   Return for PSA only 6 months, PSA/ DRE in 12 month.  [facility] [address], [address] [place],  [address] [phone]  I, Nethusan Sivanesan, am acting as a scribe for Dr. Ashley Brandon,  I have reviewed the above documentation for accuracy and completeness, and I agree with the above.   Ashley Brandon, MD   

## 2019-09-07 ENCOUNTER — Ambulatory Visit: Payer: PPO | Admitting: Urology

## 2019-09-07 ENCOUNTER — Other Ambulatory Visit: Payer: Self-pay

## 2019-09-07 ENCOUNTER — Encounter: Payer: Self-pay | Admitting: Urology

## 2019-09-07 VITALS — BP 165/69 | HR 77 | Ht 70.0 in | Wt 202.0 lb

## 2019-09-07 DIAGNOSIS — C61 Malignant neoplasm of prostate: Secondary | ICD-10-CM

## 2019-09-08 ENCOUNTER — Telehealth: Payer: Self-pay | Admitting: Family Medicine

## 2019-09-08 DIAGNOSIS — E119 Type 2 diabetes mellitus without complications: Secondary | ICD-10-CM

## 2019-09-08 MED ORDER — BLOOD GLUCOSE METER KIT: PACK | 0 refills | Status: DC

## 2019-09-08 NOTE — Telephone Encounter (Signed)
New rx was faxed to pharmacy.

## 2019-09-08 NOTE — Telephone Encounter (Signed)
Caryl Pina with [employer] called in on behalf of patient stating the accu-check monitor and supplies,blood glucose meter kit and supplies, that were called it are not covered. Patient is needing one touch ultra, like he used in the past. Please call patient when new script is sent in. Please advise.

## 2019-09-28 NOTE — Progress Notes (Signed)
Patient: Juan Mack Male    DOB: 1945-07-16   74 y.o.   MRN: [id number] Visit Date: 09/29/2019  Today's Provider: Wilhemena Durie, MD   Chief Complaint  Patient presents with  . Hypertension  . Hyperlipidemia  . Diabetes   Subjective:     HPI  Patient is feeling well.  He has no complaints. Type 2 diabetes mellitus without complication, without long-term current use of insulin From 03/30/2019-On metformin and Actos. Hemoglobin A1c 7.0.  Essential hypertension From 03/30/2019-On amlodipine and benazepril.  Pure hypercholesterolemia From 03/30/2019-labs checked showing-stable.    Diabetes Mellitus Type II, Follow-up:   Lab Results  Component Value Date   HGBA1C 6.6 (A) 09/29/2019   HGBA1C 7.0 (H) 03/30/2019   HGBA1C 7.3 (A) 08/27/2018   Last seen for diabetes 6 months ago.  Management since then includes no changes. He reports excellent compliance with treatment. He is not having side effects.  Current symptoms include none and have been stable. Home blood sugar records: fasting range: mid 100's  Episodes of hypoglycemia? no   Current Insulin Regimen: None Most Recent Eye Exam: 06/02/2019 Weight trend: stable Current diet: in general, a "healthy" diet   Current exercise: Pt does not exercise but he states he stays active including walking.  ------------------------------------------------------------------------   Hypertension, follow-up:  BP Readings from Last 3 Encounters:  09/29/19 (!) 144/64  09/07/19 (!) 165/69  03/30/19 132/68    He was last seen for hypertension 6 months ago.  BP at that visit was 132/68. Management since that visit includes no changes He reports excellent compliance with treatment. He is not having side effects.  He is exercising. He is adherent to low salt diet.   Outside blood pressures are normal at home. He is experiencing none.  Patient denies chest pain, fatigue, near-syncope, palpitations and syncope.    Cardiovascular risk factors include advanced age (older than 68 for men, 80 for women), diabetes mellitus, dyslipidemia, hypertension and male gender.  Use of agents associated with hypertension: none.   ------------------------------------------------------------------------    Lipid/Cholesterol, Follow-up:   Last seen for this 6 months ago.  Management since that visit includes No changes.  Last Lipid Panel:    Component Value Date/Time   CHOL 116 03/30/2019 1056   TRIG 151 (H) 03/30/2019 1056   HDL 32 (L) 03/30/2019 1056   CHOLHDL 3.6 03/30/2019 1056   LDLCALC 58 03/30/2019 1056    He reports excellent compliance with treatment. He is not having side effects.   Wt Readings from Last 3 Encounters:  09/29/19 204 lb (92.5 kg)  09/07/19 202 lb (91.6 kg)  03/30/19 205 lb (93 kg)    ------------------------------------------------------------------------     No Known Allergies   Current Outpatient Medications:  .  amLODipine (NORVASC) 10 MG tablet, TAKE 1 TABLET(10 MG) BY MOUTH DAILY, Disp: 90 tablet, Rfl: 3 .  aspirin 81 MG EC tablet, Take 81 mg by mouth daily. Swallow whole., Disp: , Rfl:  .  atorvastatin (LIPITOR) 20 MG tablet, TAKE 1 TABLET(20 MG) BY MOUTH DAILY, Disp: 90 tablet, Rfl: 3 .  benazepril (LOTENSIN) 40 MG tablet, TAKE 1 TABLET BY MOUTH EVERY DAY, Disp: 30 tablet, Rfl: 11 .  blood glucose meter kit and supplies, Dispense based on patient and insurance preference. Use once time daily as directed. (FOR ICD-E11.9)., Disp: 1 each, Rfl: 0 .  glucose blood test strip, Use as instructed, Disp: 100 each, Rfl: 12 .  latanoprost (XALATAN) 0.005 %  ophthalmic solution, Place 1 drop into both eyes at bedtime., Disp: , Rfl:  .  metFORMIN (GLUCOPHAGE) 1000 MG tablet, Take 1 tablet (1,000 mg total) by mouth 2 (two) times daily., Disp: 180 tablet, Rfl: 3 .  pioglitazone (ACTOS) 45 MG tablet, TAKE 1 TABLET(45 MG) BY MOUTH DAILY, Disp: 90 tablet, Rfl: 0  Review of Systems   Constitutional: Negative.  Negative for appetite change, chills and fever.  HENT: Negative.   Eyes: Negative.   Respiratory: Negative.  Negative for chest tightness, shortness of breath and wheezing.   Cardiovascular: Negative.  Negative for chest pain and palpitations.  Gastrointestinal: Negative.  Negative for abdominal pain, nausea and vomiting.  Endocrine: Negative.   Skin: Negative for wound.  Allergic/Immunologic: Negative.   Neurological: Negative for dizziness, light-headedness and headaches.  Hematological: Negative.   Psychiatric/Behavioral: Negative.     Social History   Tobacco Use  . Smoking status: Former Smoker    Years: 14.00    Types: Cigarettes    Quit date: 07/08/1973    Years since quitting: 46.2  . Smokeless tobacco: Never Used  Substance Use Topics  . Alcohol use: No    Alcohol/week: 0.0 standard drinks      Objective:   BP (!) 144/64 (BP Location: Left Arm, Patient Position: Sitting, Cuff Size: Large)   Pulse 75   Temp (!) 97.1 F (36.2 C) (Temporal)   Wt 204 lb (92.5 kg)   SpO2 99%   BMI 29.27 kg/m  Vitals:   09/29/19 1016  BP: (!) 144/64  Pulse: 75  Temp: (!) 97.1 F (36.2 C)  TempSrc: Temporal  SpO2: 99%  Weight: 204 lb (92.5 kg)  Body mass index is 29.27 kg/m.   Physical Exam Vitals reviewed.  Constitutional:      Appearance: Normal appearance. He is well-developed.  HENT:     Head: Normocephalic and atraumatic.     Right Ear: External ear normal.     Left Ear: External ear normal.     Nose: Nose normal.  Eyes:     Conjunctiva/sclera: Conjunctivae normal.     Pupils: Pupils are equal, round, and reactive to light.  Cardiovascular:     Rate and Rhythm: Normal rate and regular rhythm.     Heart sounds: Normal heart sounds.  Pulmonary:     Effort: Pulmonary effort is normal.     Breath sounds: Normal breath sounds.  Abdominal:     General: Bowel sounds are normal.     Palpations: Abdomen is soft.  Musculoskeletal:      Cervical back: Normal range of motion and neck supple.  Skin:    General: Skin is warm and dry.  Neurological:     General: No focal deficit present.     Mental Status: He is alert and oriented to person, place, and time.     Comments: Normal diabetic monofilament exam.  Psychiatric:        Mood and Affect: Mood normal.        Behavior: Behavior normal.        Thought Content: Thought content normal.        Judgment: Judgment normal.     Diabetic Foot Exam - Simple   Simple Foot Form Diabetic Foot exam was performed with the following findings: Yes 09/29/2019 10:55 AM  Visual Inspection No deformities, no ulcerations, no other skin breakdown bilaterally: Yes Sensation Testing Intact to touch and monofilament testing bilaterally: Yes Pulse Check Posterior Tibialis and Dorsalis pulse intact bilaterally:  Yes Comments     Results for orders placed or performed in visit on 09/29/19  POCT glycosylated hemoglobin (Hb A1C)  Result Value Ref Range   Hemoglobin A1C 6.6 (A) 4.0 - 5.6 %       Assessment & Plan    1. Type 2 diabetes mellitus without complication, without long-term current use of insulin (HCC) A1C well controlled at 6.6.  Continue current medications recheck at CPE in six months.   - POCT glycosylated hemoglobin (Hb A1C)  2. Essential (primary) hypertension Well controlled no changes in medications.  3. Pure hypercholesterolemia      Wilhemena Durie, MD  [facility]

## 2019-09-29 ENCOUNTER — Other Ambulatory Visit: Payer: Self-pay

## 2019-09-29 ENCOUNTER — Encounter: Payer: Self-pay | Admitting: Family Medicine

## 2019-09-29 ENCOUNTER — Ambulatory Visit (INDEPENDENT_AMBULATORY_CARE_PROVIDER_SITE_OTHER): Payer: PPO | Admitting: Family Medicine

## 2019-09-29 VITALS — BP 144/64 | HR 75 | Temp 97.1°F | Wt 204.0 lb

## 2019-09-29 DIAGNOSIS — E119 Type 2 diabetes mellitus without complications: Secondary | ICD-10-CM

## 2019-09-29 DIAGNOSIS — I10 Essential (primary) hypertension: Secondary | ICD-10-CM

## 2019-09-29 DIAGNOSIS — E7800 Pure hypercholesterolemia, unspecified: Secondary | ICD-10-CM

## 2019-09-29 LAB — POCT GLYCOSYLATED HEMOGLOBIN (HGB A1C): Hemoglobin A1C: 6.6 % — AB (ref 4.0–5.6)

## 2019-11-03 DIAGNOSIS — E119 Type 2 diabetes mellitus without complications: Secondary | ICD-10-CM | POA: Diagnosis not present

## 2019-11-03 LAB — HM DIABETES EYE EXAM

## 2019-11-08 ENCOUNTER — Encounter: Payer: Self-pay | Admitting: *Deleted

## 2019-11-28 ENCOUNTER — Other Ambulatory Visit: Payer: Self-pay | Admitting: Family Medicine

## 2019-11-28 DIAGNOSIS — I10 Essential (primary) hypertension: Secondary | ICD-10-CM

## 2019-11-28 DIAGNOSIS — E119 Type 2 diabetes mellitus without complications: Secondary | ICD-10-CM

## 2019-11-28 NOTE — Telephone Encounter (Signed)
Requested Prescriptions  Pending Prescriptions Disp Refills  . pioglitazone (ACTOS) 45 MG tablet [Pharmacy Med Name: PIOGLITAZONE 45MG  TABLETS] 90 tablet 0    Sig: TAKE 1 TABLET(45 MG) BY MOUTH DAILY     Endocrinology:  Diabetes - Glitazones - pioglitazone Passed - 11/28/2019  3:22 AM      Passed - HBA1C is between 0 and 7.9 and within 180 days    Hemoglobin A1C  Date Value Ref Range Status  09/29/2019 6.6 (A) 4.0 - 5.6 % Final   Hgb A1c MFr Bld  Date Value Ref Range Status  03/30/2019 7.0 (H) 4.8 - 5.6 % Final    Comment:             Prediabetes: 5.7 - 6.4          Diabetes: >6.4          Glycemic control for adults with diabetes: <7.0          Passed - Valid encounter within last 6 months    Recent Outpatient Visits          2 months ago Type 2 diabetes mellitus without complication, without long-term current use of insulin [facility])   [facility] Jerrol Banana., MD   8 months ago Annual physical exam   [facility] Jerrol Banana., MD   1 year ago Type 2 diabetes mellitus without complication, without long-term current use of insulin [facility])   [facility] Jerrol Banana., MD   1 year ago Encounter for annual physical exam   [facility] Jerrol Banana., MD   2 years ago [place] Jerrol Banana., MD      Future Appointments            In 9 months Hollice Espy, MD [facility]           . amLODipine (NORVASC) 10 MG tablet [Pharmacy Med Name: AMLODIPINE BESYLATE 10MG  TABLETS] 90 tablet 3    Sig: TAKE 1 TABLET(10 MG) BY MOUTH DAILY     Cardiovascular:  Calcium Channel Blockers Failed - 11/28/2019  3:22 AM      Failed - Last BP in normal range    BP Readings from Last 1 Encounters:  09/29/19 (!) 144/64         Passed - Valid encounter within last 6 months    Recent Outpatient Visits          2 months ago Type 2 diabetes  mellitus without complication, without long-term current use of insulin [facility])   [facility] Jerrol Banana., MD   8 months ago Annual physical exam   [facility] Jerrol Banana., MD   1 year ago Type 2 diabetes mellitus without complication, without long-term current use of insulin [facility])   [facility] Jerrol Banana., MD   1 year ago Encounter for annual physical exam   [facility] Jerrol Banana., MD   2 years ago [place] Jerrol Banana., MD      Future Appointments            In 9 months Hollice Espy, MD [facility]

## 2019-12-13 DIAGNOSIS — I10 Essential (primary) hypertension: Secondary | ICD-10-CM | POA: Diagnosis not present

## 2019-12-13 DIAGNOSIS — H2512 Age-related nuclear cataract, left eye: Secondary | ICD-10-CM | POA: Diagnosis not present

## 2019-12-16 ENCOUNTER — Encounter: Payer: Self-pay | Admitting: Ophthalmology

## 2019-12-18 ENCOUNTER — Other Ambulatory Visit: Payer: Self-pay | Admitting: Family Medicine

## 2019-12-23 ENCOUNTER — Other Ambulatory Visit
Admission: RE | Admit: 2019-12-23 | Discharge: 2019-12-23 | Disposition: A | Discharge status: Home / Self Care | Payer: PPO | Source: Ambulatory Visit | Attending: Ophthalmology | Admitting: Ophthalmology

## 2019-12-23 ENCOUNTER — Other Ambulatory Visit: Payer: Self-pay

## 2019-12-23 DIAGNOSIS — Z01812 Encounter for preprocedural laboratory examination: Secondary | ICD-10-CM | POA: Insufficient documentation

## 2019-12-23 DIAGNOSIS — Z20822 Contact with and (suspected) exposure to covid-19: Secondary | ICD-10-CM | POA: Diagnosis not present

## 2019-12-23 LAB — SARS CORONAVIRUS 2 (TAT 6-24 HRS): SARS Coronavirus 2: NEGATIVE

## 2019-12-23 NOTE — Discharge Instructions (Signed)

## 2019-12-27 ENCOUNTER — Ambulatory Visit: Payer: PPO | Admitting: Anesthesiology

## 2019-12-27 ENCOUNTER — Ambulatory Visit
Admission: RE | Admit: 2019-12-27 | Discharge: 2019-12-27 | Disposition: A | Discharge status: Home / Self Care | Payer: PPO | Attending: Ophthalmology | Admitting: Ophthalmology

## 2019-12-27 ENCOUNTER — Encounter: Payer: Self-pay | Admitting: Ophthalmology

## 2019-12-27 ENCOUNTER — Encounter
Admission: RE | Disposition: A | Discharge status: Home / Self Care | Payer: Self-pay | Source: Home / Self Care | Attending: Ophthalmology

## 2019-12-27 ENCOUNTER — Other Ambulatory Visit: Payer: Self-pay

## 2019-12-27 DIAGNOSIS — I10 Essential (primary) hypertension: Secondary | ICD-10-CM | POA: Insufficient documentation

## 2019-12-27 DIAGNOSIS — Z87891 Personal history of nicotine dependence: Secondary | ICD-10-CM | POA: Insufficient documentation

## 2019-12-27 DIAGNOSIS — G4730 Sleep apnea, unspecified: Secondary | ICD-10-CM | POA: Diagnosis not present

## 2019-12-27 DIAGNOSIS — Z79899 Other long term (current) drug therapy: Secondary | ICD-10-CM | POA: Insufficient documentation

## 2019-12-27 DIAGNOSIS — Z7984 Long term (current) use of oral hypoglycemic drugs: Secondary | ICD-10-CM | POA: Diagnosis not present

## 2019-12-27 DIAGNOSIS — E119 Type 2 diabetes mellitus without complications: Secondary | ICD-10-CM | POA: Insufficient documentation

## 2019-12-27 DIAGNOSIS — H25812 Combined forms of age-related cataract, left eye: Secondary | ICD-10-CM | POA: Diagnosis not present

## 2019-12-27 DIAGNOSIS — Z7982 Long term (current) use of aspirin: Secondary | ICD-10-CM | POA: Diagnosis not present

## 2019-12-27 DIAGNOSIS — H2512 Age-related nuclear cataract, left eye: Secondary | ICD-10-CM | POA: Insufficient documentation

## 2019-12-27 HISTORY — PX: CATARACT EXTRACTION W/PHACO: SHX586

## 2019-12-27 LAB — GLUCOSE, CAPILLARY
Glucose-Capillary: 142 mg/dL — ABNORMAL HIGH (ref 70–99)
Glucose-Capillary: 140 mg/dL — ABNORMAL HIGH (ref 70–99)

## 2019-12-27 SURGERY — PHACOEMULSIFICATION, CATARACT, WITH IOL INSERTION
Anesthesia: Monitor Anesthesia Care | Site: Eye | Laterality: Left

## 2019-12-27 MED ORDER — SODIUM HYALURONATE 10 MG/ML IO SOLN
INTRAOCULAR | Status: DC | PRN
  Administered 2019-12-27: 0.55 mL via INTRAOCULAR

## 2019-12-27 MED ORDER — OXYCODONE HCL 5 MG PO TABS: 5.0000 mg | ORAL_TABLET | Freq: Once | ORAL | Status: DC | PRN

## 2019-12-27 MED ORDER — MOXIFLOXACIN HCL 0.5 % OP SOLN
OPHTHALMIC | Status: DC | PRN
  Administered 2019-12-27: 0.2 mL via OPHTHALMIC

## 2019-12-27 MED ORDER — TETRACAINE HCL 0.5 % OP SOLN
1.0000 [drp] | OPHTHALMIC | Status: DC | PRN
  Administered 2019-12-27 (×3): 1 [drp] via OPHTHALMIC

## 2019-12-27 MED ORDER — LIDOCAINE HCL (PF) 2 % IJ SOLN
INTRAOCULAR | Status: DC | PRN
  Administered 2019-12-27: 1 mL via INTRAOCULAR

## 2019-12-27 MED ORDER — MIDAZOLAM HCL 2 MG/2ML IJ SOLN
INTRAMUSCULAR | Status: DC | PRN
  Administered 2019-12-27: 1 mg via INTRAVENOUS

## 2019-12-27 MED ORDER — EPINEPHRINE PF 1 MG/ML IJ SOLN
INTRAOCULAR | Status: DC | PRN
  Administered 2019-12-27: 60 mL via OPHTHALMIC

## 2019-12-27 MED ORDER — SODIUM HYALURONATE 23 MG/ML IO SOLN
INTRAOCULAR | Status: DC | PRN
  Administered 2019-12-27: 0.6 mL via INTRAOCULAR

## 2019-12-27 MED ORDER — FENTANYL CITRATE (PF) 100 MCG/2ML IJ SOLN
INTRAMUSCULAR | Status: DC | PRN
  Administered 2019-12-27: 50 ug via INTRAVENOUS

## 2019-12-27 MED ORDER — ARMC OPHTHALMIC DILATING DROPS
1.0000 "application " | OPHTHALMIC | Status: DC | PRN
  Administered 2019-12-27 (×3): 1 via OPHTHALMIC

## 2019-12-27 MED ORDER — OXYCODONE HCL 5 MG/5ML PO SOLN: 5.0000 mg | Freq: Once | ORAL | Status: DC | PRN

## 2019-12-27 SURGICAL SUPPLY — 20 items
CANNULA ANT/CHMB 27G (MISCELLANEOUS) ×2 IMPLANT
CANNULA ANT/CHMB 27GA (MISCELLANEOUS) ×6 IMPLANT
DISSECTOR HYDRO NUCLEUS 50X22 (MISCELLANEOUS) ×3 IMPLANT
GLOVE SURG LX 7.5 STRW (GLOVE) ×2
GLOVE SURG LX STRL 7.5 STRW (GLOVE) ×1 IMPLANT
GLOVE SURG SYN 8.5  E (GLOVE) ×2
GLOVE SURG SYN 8.5 E (GLOVE) ×1 IMPLANT
GLOVE SURG SYN 8.5 PF PI (GLOVE) ×1 IMPLANT
GOWN STRL REUS W/ TWL LRG LVL3 (GOWN DISPOSABLE) ×2 IMPLANT
GOWN STRL REUS W/TWL LRG LVL3 (GOWN DISPOSABLE) ×4
LENS IOL DIOP 19.5 (Intraocular Lens) ×3 IMPLANT
LENS IOL TECNIS MONO 19.5 (Intraocular Lens) IMPLANT
MARKER SKIN DUAL TIP RULER LAB (MISCELLANEOUS) ×3 IMPLANT
PACK DR. KING ARMS (PACKS) ×3 IMPLANT
PACK EYE AFTER SURG (MISCELLANEOUS) ×3 IMPLANT
PACK OPTHALMIC (MISCELLANEOUS) ×3 IMPLANT
SYR 3ML LL SCALE MARK (SYRINGE) ×3 IMPLANT
SYR TB 1ML LUER SLIP (SYRINGE) ×3 IMPLANT
WATER STERILE IRR 250ML POUR (IV SOLUTION) ×3 IMPLANT
WIPE NON LINTING 3.25X3.25 (MISCELLANEOUS) ×3 IMPLANT

## 2019-12-27 NOTE — Op Note (Signed)
OPERATIVE NOTE  Juan Mack [id number] 12/27/2019   PREOPERATIVE DIAGNOSIS:  Nuclear sclerotic cataract left eye.  H25.12   POSTOPERATIVE DIAGNOSIS:    Nuclear sclerotic cataract left eye.     PROCEDURE:  Phacoemusification with posterior chamber intraocular lens placement of the left eye   LENS:   Implant Name Type Inv. Item Serial No. Manufacturer Lot No. LRB No. Used Action  LENS IOL DIOP 19.5 - [id number] Intraocular Lens LENS IOL DIOP 19.5 [id number] [employer]  Left 1 Implanted      Procedure(s): CATARACT EXTRACTION PHACO AND INTRAOCULAR LENS PLACEMENT (IOC) LEFT DIABETIC 2.58  00:23.8 (Left)  DCB00 +19.5   ULTRASOUND TIME: 0 minutes 23 seconds.  CDE 2.58   SURGEON:  Benay Pillow, MD, MPH   ANESTHESIA:  Topical with tetracaine drops augmented with 1% preservative-free intracameral lidocaine.  ESTIMATED BLOOD LOSS: <1 mL   COMPLICATIONS:  None.   DESCRIPTION OF PROCEDURE:  The patient was identified in the holding room and transported to the operating room and placed in the supine position under the operating microscope.  The left eye was identified as the operative eye and it was prepped and draped in the usual sterile ophthalmic fashion.   A 1.0 millimeter clear-corneal paracentesis was made at the 5:00 position. 0.5 ml of preservative-free 1% lidocaine with epinephrine was injected into the anterior chamber.  The anterior chamber was filled with Healon 5 viscoelastic.  A 2.4 millimeter keratome was used to make a near-clear corneal incision at the 2:00 position.  A curvilinear capsulorrhexis was made with a cystotome and capsulorrhexis forceps.  Balanced salt solution was used to hydrodissect and hydrodelineate the nucleus.   Phacoemulsification was then used in stop and chop fashion to remove the lens nucleus and epinucleus.  The remaining cortex was then removed using the irrigation and aspiration handpiece. Healon was then placed into the capsular bag  to distend it for lens placement.  A lens was then injected into the capsular bag.  The remaining viscoelastic was aspirated.   Wounds were hydrated with balanced salt solution.  The anterior chamber was inflated to a physiologic pressure with balanced salt solution.  Intracameral vigamox 0.1 mL undiltued was injected into the eye and a drop placed onto the ocular surface.  No wound leaks were noted.  The patient was taken to the recovery room in stable condition without complications of anesthesia or surgery  Benay Pillow 12/27/2019, 9:42 AM

## 2019-12-27 NOTE — Anesthesia Preprocedure Evaluation (Signed)
Anesthesia Evaluation  Patient identified by MRN, date of birth, ID band Patient awake    Reviewed: NPO status   History of Anesthesia Complications Negative for: history of anesthetic complications  Airway Mallampati: II  TM Distance: >3 FB Neck ROM: full    Dental no notable dental hx.    Pulmonary sleep apnea (no cpap) , former smoker,    Pulmonary exam normal        Cardiovascular Exercise Tolerance: Good hypertension, Normal cardiovascular exam     Neuro/Psych HOH Glaucoma Central vein occlusion of retina ??? negative psych ROS   GI/Hepatic negative GI ROS, Neg liver ROS,   Endo/Other  diabetes  Renal/GU negative Renal ROS  negative genitourinary   Musculoskeletal   Abdominal   Peds  Hematology negative hematology ROS (+)   Anesthesia Other Findings Covid: NEG.  Reproductive/Obstetrics                             Anesthesia Physical Anesthesia Plan  ASA: II  Anesthesia Plan: MAC   Post-op Pain Management:    Induction:   PONV Risk Score and Plan: 1 and TIVA  Airway Management Planned:   Additional Equipment:   Intra-op Plan:   Post-operative Plan:   Informed Consent: I have reviewed the patients History and Physical, chart, labs and discussed the procedure including the risks, benefits and alternatives for the proposed anesthesia with the patient or authorized representative who has indicated his/her understanding and acceptance.       Plan Discussed with: CRNA  Anesthesia Plan Comments:         Anesthesia Quick Evaluation

## 2019-12-27 NOTE — Anesthesia Postprocedure Evaluation (Signed)
Anesthesia Post Note  Patient: Juan Mack  Procedure(s) Performed: CATARACT EXTRACTION PHACO AND INTRAOCULAR LENS PLACEMENT (IOC) LEFT DIABETIC 2.58  00:23.8 (Left Eye)     Patient location during evaluation: PACU Anesthesia Type: MAC Level of consciousness: awake and alert Pain management: pain level controlled Vital Signs Assessment: post-procedure vital signs reviewed and stable Respiratory status: spontaneous breathing, nonlabored ventilation, respiratory function stable and patient connected to nasal cannula oxygen Cardiovascular status: stable and blood pressure returned to baseline Postop Assessment: no apparent nausea or vomiting Anesthetic complications: no   No complications documented.  Fidel Levy

## 2019-12-27 NOTE — Transfer of Care (Signed)
Immediate Anesthesia Transfer of Care Note  Patient: Juan Mack  Procedure(s) Performed: CATARACT EXTRACTION PHACO AND INTRAOCULAR LENS PLACEMENT (IOC) LEFT DIABETIC 2.58  00:23.8 (Left Eye)  Patient Location: PACU  Anesthesia Type: MAC  Level of Consciousness: awake, alert  and patient cooperative  Airway and Oxygen Therapy: Patient Spontanous Breathing and Patient connected to supplemental oxygen  Post-op Assessment: Post-op Vital signs reviewed, Patient's Cardiovascular Status Stable, Respiratory Function Stable, Patent Airway and No signs of Nausea or vomiting  Post-op Vital Signs: Reviewed and stable  Complications: No complications documented.

## 2019-12-27 NOTE — H&P (Signed)

## 2019-12-27 NOTE — Anesthesia Procedure Notes (Signed)
Procedure Name: MAC Date/Time: 12/27/2019 9:22 AM Performed by: Cameron Ali, CRNA Pre-anesthesia Checklist: Patient identified, Emergency Drugs available, Suction available, Timeout performed and Patient being monitored Patient Re-evaluated:Patient Re-evaluated prior to induction Oxygen Delivery Method: Nasal cannula Placement Confirmation: positive ETCO2

## 2019-12-28 ENCOUNTER — Encounter: Payer: Self-pay | Admitting: Ophthalmology

## 2020-03-12 ENCOUNTER — Other Ambulatory Visit: Payer: Self-pay | Admitting: Family Medicine

## 2020-03-14 ENCOUNTER — Other Ambulatory Visit: Payer: PPO

## 2020-03-14 ENCOUNTER — Other Ambulatory Visit: Payer: Self-pay

## 2020-03-14 DIAGNOSIS — C61 Malignant neoplasm of prostate: Secondary | ICD-10-CM | POA: Diagnosis not present

## 2020-03-15 ENCOUNTER — Telehealth: Payer: Self-pay

## 2020-03-15 LAB — PSA: Prostate Specific Ag, Serum: 3 ng/mL (ref 0.0–4.0)

## 2020-03-15 NOTE — Telephone Encounter (Signed)
Pt aware, will keep follow up

## 2020-03-15 NOTE — Telephone Encounter (Signed)
-----   Message from Hollice Espy, MD sent at 03/15/2020  8:06 AM EDT ----- PSA is stable at 3.0.  See you in 3 months as scheduled.

## 2020-03-23 ENCOUNTER — Other Ambulatory Visit: Payer: Self-pay | Admitting: Family Medicine

## 2020-03-29 NOTE — Progress Notes (Signed)
Subjective:   Juan Mack is a 74 y.o. male who presents for Medicare Annual/Subsequent preventive examination.  I connected with Francoise Schaumann today by telephone and verified that I am speaking with the correct person using two identifiers. Location patient: home Location provider: work Persons participating in the virtual visit: patient, provider.   I discussed the limitations, risks, security and privacy concerns of performing an evaluation and management service by telephone and the availability of in person appointments. I also discussed with the patient that there may be a patient responsible charge related to this service. The patient expressed understanding and verbally consented to this telephonic visit.    Interactive audio and video telecommunications were attempted between this provider and patient, however failed, due to patient having technical difficulties OR patient did not have access to video capability.  We continued and completed visit with audio only.   Review of Systems    N/A  Cardiac Risk Factors include: advanced age (>12mn, >>50women);dyslipidemia;diabetes mellitus;hypertension;male gender     Objective:    There were no vitals filed for this visit. There is no height or weight on file to calculate BMI.  Advanced Directives 03/30/2020 12/27/2019 03/29/2019 03/26/2018 10/24/2016 05/20/2016 03/21/2016  Does Patient Have a Medical Advance Directive? No Yes No No No No No  Type of Advance Directive - Living will;Healthcare Power of Attorney - - - - -  Does patient want to make changes to medical advance directive? - No - Patient declined - - - - -  Copy of [place]? - No - copy requested - - - - -  Would patient like information on creating a medical advance directive? No - Patient declined - No - Patient declined No - Patient declined No - Patient declined - No - patient declined information    Current Medications  (verified) Outpatient Encounter Medications as of 03/30/2020  Medication Sig  . amLODipine (NORVASC) 10 MG tablet TAKE 1 TABLET(10 MG) BY MOUTH DAILY  . aspirin 81 MG EC tablet Take 81 mg by mouth daily. Swallow whole.  .Marland Kitchenatorvastatin (LIPITOR) 20 MG tablet TAKE 1 TABLET(20 MG) BY MOUTH DAILY  . benazepril (LOTENSIN) 40 MG tablet TAKE 1 TABLET BY MOUTH EVERY DAY  . blood glucose meter kit and supplies Dispense based on patient and insurance preference. Use once time daily as directed. (FOR ICD-E11.9).  .Marland Kitchenglucose blood test strip Use as instructed  . latanoprost (XALATAN) 0.005 % ophthalmic solution Place 1 drop into both eyes at bedtime.  . metFORMIN (GLUCOPHAGE) 1000 MG tablet TAKE 1 TABLET(1000 MG) BY MOUTH TWICE DAILY  . pioglitazone (ACTOS) 45 MG tablet TAKE 1 TABLET(45 MG) BY MOUTH DAILY   No facility-administered encounter medications on file as of 03/30/2020.    Allergies (verified) Patient has no known allergies.   History: Past Medical History:  Diagnosis Date  . Anxiety   . Anxiety disorder   . BCC (basal cell carcinoma)   . Central vein occlusion of retina   . Dermatitis   . Diabetes mellitus, type II (HMan   . Elevated PSA   . Hyperlipemia   . Hyperlipidemia   . Hypertension   . Insomnia   . Insomnia   . OSA on CPAP    not using CPAP  . Prostate nodule   . Pruritus   . Stable central retinal vein occlusion    Past Surgical History:  Procedure Laterality Date  . CATARACT EXTRACTION W/PHACO Left 12/27/2019  Procedure: CATARACT EXTRACTION PHACO AND INTRAOCULAR LENS PLACEMENT (IOC) LEFT DIABETIC 2.58  00:23.8;  Surgeon: Eulogio Bear, MD;  Location: [place];  Service: Ophthalmology;  Laterality: Left;  . COLONOSCOPY WITH PROPOFOL N/A 03/21/2016   Procedure: COLONOSCOPY WITH PROPOFOL;  Surgeon: Manya Silvas, MD;  Location[facility] ENDOSCOPY;  Service: Endoscopy;  Laterality: N/A;  . COLONOSCOPY WITH PROPOFOL N/A 11/13/2016   Procedure: COLONOSCOPY  WITH PROPOFOL;  Surgeon: Manya Silvas, MD;  Location[facility] ENDOSCOPY;  Service: Endoscopy;  Laterality: N/A;  . NO PAST SURGERIES    . TOOTH EXTRACTION     Family History  Problem Relation Age of Onset  . Prostate cancer Brother        Father  . Diabetes Brother   . Colon cancer Brother   . Pancreatic cancer Sister   . Lung cancer Sister   . Coronary artery disease Father   . Heart disease Father   . Congestive Heart Failure Mother   . Diabetes Mother    Social History   Socioeconomic History  . Marital status: Married    Spouse name: Not on file  . Number of children: 1  . Years of education: Not on file  . Highest education level: High school graduate  Occupational History  . Occupation: retired  Tobacco Use  . Smoking status: Former Smoker    Years: 14.00    Types: Cigarettes    Quit date: 07/08/1973    Years since quitting: 46.7  . Smokeless tobacco: Never Used  Vaping Use  . Vaping Use: Never used  Substance and Sexual Activity  . Alcohol use: No    Alcohol/week: 0.0 standard drinks  . Drug use: No  . Sexual activity: Not on file  Other Topics Concern  . Not on file  Social History Narrative  . Not on file   Social Determinants of Health   Financial Resource Strain: Low Risk   . Difficulty of Paying Living Expenses: Not hard at all  Food Insecurity: No Food Insecurity  . Worried About Charity fundraiser in the Last Year: Never true  . Ran Out of Food in the Last Year: Never true  Transportation Needs: No Transportation Needs  . Lack of Transportation (Medical): No  . Lack of Transportation (Non-Medical): No  Physical Activity: Insufficiently Active  . Days of Exercise per Week: 3 days  . Minutes of Exercise per Session: 20 min  Stress: No Stress Concern Present  . Feeling of Stress : Not at all  Social Connections: Moderately Isolated  . Frequency of Communication with Friends and Family: More than three times a week  . Frequency of Social  Gatherings with Friends and Family: Twice a week  . Attends Religious Services: Never  . Active Member of Clubs or Organizations: No  . Attends Archivist Meetings: Never  . Marital Status: Married    Tobacco Counseling Counseling given: Not Answered   Clinical Intake:  Pre-visit preparation completed: Yes  Pain : No/denies pain     Nutritional Risks: None Diabetes: Yes  How often do you need to have someone help you when you read instructions, pamphlets, or other written materials from your doctor or pharmacy?: 1 - Never  Diabetic? Yes  Nutrition Risk Assessment:  Has the patient had any N/V/D within the last 2 months?  No  Does the patient have any non-healing wounds?  No  Has the patient had any unintentional weight loss or weight gain?  No  Diabetes:  Is the patient diabetic?  Yes  If diabetic, was a CBG obtained today?  No  Did the patient bring in their glucometer from home?  No  How often do you monitor your CBG's? Once every other day.   Financial Strains and Diabetes Management:  Are you having any financial strains with the device, your supplies or your medication? No .  Does the patient want to be seen by Chronic Care Management for management of their diabetes?  No  Would the patient like to be referred to a Nutritionist or for Diabetic Management?  No   Diabetic Exams:  Diabetic Eye Exam: Completed 11/03/19. Diabetic Foot Exam: Completed 09/29/19.   Interpreter Needed?: No  Information entered by :: MMarkoski, LPN   Activities of Daily Living In your present state of health, do you have any difficulty performing the following activities: 03/30/2020 12/27/2019  Hearing? N N  Vision? N N  Difficulty concentrating or making decisions? N N  Walking or climbing stairs? N N  Dressing or bathing? N N  Doing errands, shopping? N -  Preparing Food and eating ? N -  Using the Toilet? N -  In the past six months, have you accidently leaked  urine? N -  Do you have problems with loss of bowel control? N -  Managing your Medications? N -  Managing your Finances? N -  Housekeeping or managing your Housekeeping? N -  Some recent data might be hidden    Patient Care Team: Jerrol Banana., MD as PCP - General (Family Medicine) Hollice Espy, MD as Consulting Physician (Urology) Eulogio Bear, MD as Consulting Physician (Ophthalmology)  Indicate any recent Medical Services you may have received from other than [facility] providers in the past year (date may be approximate).     Assessment:   This is a routine wellness examination for [place].  Hearing/Vision screen No exam data present  Dietary issues and exercise activities discussed: Current Exercise Habits: Home exercise routine, Type of exercise: walking, Time (Minutes): 20, Frequency (Times/Week): 3, Weekly Exercise (Minutes/Week): 60, Intensity: Mild, Exercise limited by: None identified  Goals    . Increase water intake     Recommend increasing water intake to 4 glasses a day.      Depression Screen PHQ 2/9 Scores 03/30/2020 03/29/2019 08/27/2018 03/26/2018 03/26/2018 10/24/2016 10/24/2016  PHQ - 2 Score 0 0 0 0 0 0 0  PHQ- 9 Score - - - 0 - 0 -    Fall Risk Fall Risk  03/30/2020 03/30/2019 03/29/2019 08/27/2018 03/26/2018  Falls in the past year? 0 0 0 0 No  Number falls in past yr: 0 0 - - -  Injury with Fall? 0 0 - - -  Follow up - Falls evaluation completed - - -    Any stairs in or around the home? Yes  If so, are there any without handrails? No  Home free of loose throw rugs in walkways, pet beds, electrical cords, etc? Yes  Adequate lighting in your home to reduce risk of falls? Yes   ASSISTIVE DEVICES UTILIZED TO PREVENT FALLS:  Life alert? No  Use of a cane, walker or w/c? No  Grab bars in the bathroom? No  Shower chair or bench in shower? No  Elevated toilet seat or a handicapped toilet? No    Cognitive Function: Declined today.     6CIT  Screen 10/24/2016  What Year? 0 points  What month? 0 points  What  time? 0 points  Count back from 20 0 points  Months in reverse 0 points  Repeat phrase 2 points  Total Score 2    Immunizations Immunization History  Administered Date(s) Administered  . Fluad Quad(high Dose 65+) 03/30/2019  . Influenza Split 05/21/2011  . Influenza, High Dose Seasonal PF 04/25/2014, 03/26/2018  . Influenza,inj,Quad PF,6+ Mos 04/07/2013  . Influenza-Unspecified 04/15/2017  . PFIZER SARS-COV-2 Vaccination 08/25/2019, 09/15/2019  . Pneumococcal Conjugate-13 12/14/2013  . Pneumococcal Polysaccharide-23 02/28/2011  . Td 07/27/2003, 02/13/2015    TDAP status: Up to date Flu Vaccine status: Declined, Education has been provided regarding the importance of this vaccine but patient still declined. Advised may receive this vaccine at local pharmacy or Health Dept. Aware to provide a copy of the vaccination record if obtained from local pharmacy or Health Dept. Verbalized acceptance and understanding. Pneumococcal vaccine status: Up to date Covid-19 vaccine status: Completed vaccines  Qualifies for Shingles Vaccine? Yes   Zostavax completed No   Shingrix Completed?: No.    Education has been provided regarding the importance of this vaccine. Patient has been advised to call insurance company to determine out of pocket expense if they have not yet received this vaccine. Advised may also receive vaccine at local pharmacy or Health Dept. Verbalized acceptance and understanding.  Screening Tests Health Maintenance  Topic Date Due  . INFLUENZA VACCINE  02/06/2020  . HEMOGLOBIN A1C  03/31/2020  . FOOT EXAM  09/28/2020  . OPHTHALMOLOGY EXAM  11/02/2020  . COLONOSCOPY  11/13/2021  . TETANUS/TDAP  02/12/2025  . COVID-19 Vaccine  Completed  . Hepatitis C Screening  Completed  . PNA vac Low Risk Adult  Completed    Health Maintenance  Health Maintenance Due  Topic Date Due  . INFLUENZA VACCINE  02/06/2020     Colorectal cancer screening: Completed 11/13/16. Repeat every 5 years  Lung Cancer Screening: (Low Dose CT Chest recommended if Age 37-80 years, 30 pack-year currently smoking OR have quit w/in 15years.) does not qualify.   Additional Screening:  Hepatitis C Screening: Up to date  Vision Screening: Recommended annual ophthalmology exams for early detection of glaucoma and other disorders of the eye. Is the patient up to date with their annual eye exam?  Yes  Who is the provider or what is the name of the office in which the patient attends annual eye exams? Dr Edison Pace @ [place] If pt is not established with a provider, would they like to be referred to a provider to establish care? No .   Dental Screening: Recommended annual dental exams for proper oral hygiene  Community Resource Referral / Chronic Care Management: CRR required this visit?  No   CCM required this visit?  No      Plan:     I have personally reviewed and noted the following in the patient's chart:   . Medical and social history . Use of alcohol, tobacco or illicit drugs  . Current medications and supplements . Functional ability and status . Nutritional status . Physical activity . Advanced directives . List of other physicians . Hospitalizations, surgeries, and ER visits in previous 12 months . Vitals . Screenings to include cognitive, depression, and falls . Referrals and appointments  In addition, I have reviewed and discussed with patient certain preventive protocols, quality metrics, and best practice recommendations. A written personalized care plan for preventive services as well as general preventive health recommendations were provided to patient.     Ehtan Delfavero Hassan Buckler, LPN  03/30/2020   Nurse Notes: Pt to receive a flu shot at next in office apt.

## 2020-03-30 ENCOUNTER — Ambulatory Visit (INDEPENDENT_AMBULATORY_CARE_PROVIDER_SITE_OTHER): Payer: PPO

## 2020-03-30 ENCOUNTER — Other Ambulatory Visit: Payer: Self-pay

## 2020-03-30 DIAGNOSIS — Z0000 Encounter for general adult medical examination without abnormal findings: Secondary | ICD-10-CM | POA: Diagnosis not present

## 2020-03-30 NOTE — Patient Instructions (Signed)
Juan Mack , Thank you for taking time to come for your Medicare Wellness Visit. I appreciate your ongoing commitment to your health goals. Please review the following plan we discussed and let me know if I can assist you in the future.   Screening recommendations/referrals: Colonoscopy: Up to date, due 11/2021 Recommended yearly ophthalmology/optometry visit for glaucoma screening and checkup Recommended yearly dental visit for hygiene and checkup  Vaccinations: Influenza vaccine: Currently due Pneumococcal vaccine: Completed series Tdap vaccine: Up to date, due 02/2025 Shingles vaccine: Shingrix discussed. Please contact your pharmacy for coverage information.     Advanced directives: Advance directive discussed with you today. Even though you declined this today please call our office should you change your mind and we can give you the proper paperwork for you to fill out.  Conditions/risks identified: Recommend increasing water intake to 6-8 8 oz glasses a day.   Next appointment: 04/05/20 @ 9:00 AM with Dr Rosanna Randy   Preventive Care 27 Years and Older, Male Preventive care refers to lifestyle choices and visits with your health care provider that can promote health and wellness. What does preventive care include?  A yearly physical exam. This is also called an annual well check.  Dental exams once or twice a year.  Routine eye exams. Ask your health care provider how often you should have your eyes checked.  Personal lifestyle choices, including:  Daily care of your teeth and gums.  Regular physical activity.  Eating a healthy diet.  Avoiding tobacco and drug use.  Limiting alcohol use.  Practicing safe sex.  Taking low doses of aspirin every day.  Taking vitamin and mineral supplements as recommended by your health care provider. What happens during an annual well check? The services and screenings done by your health care provider during your annual well check will  depend on your age, overall health, lifestyle risk factors, and family history of disease. Counseling  Your health care provider may ask you questions about your:  Alcohol use.  Tobacco use.  Drug use.  Emotional well-being.  Home and relationship well-being.  Sexual activity.  Eating habits.  History of falls.  Memory and ability to understand (cognition).  Work and work Statistician. Screening  You may have the following tests or measurements:  Height, weight, and BMI.  Blood pressure.  Lipid and cholesterol levels. These may be checked every 5 years, or more frequently if you are over 71 years old.  Skin check.  Lung cancer screening. You may have this screening every year starting at age 28 if you have a 30-pack-year history of smoking and currently smoke or have quit within the past 15 years.  Fecal occult blood test (FOBT) of the stool. You may have this test every year starting at age 75.  Flexible sigmoidoscopy or colonoscopy. You may have a sigmoidoscopy every 5 years or a colonoscopy every 10 years starting at age 37.  Prostate cancer screening. Recommendations will vary depending on your family history and other risks.  Hepatitis C blood test.  Hepatitis B blood test.  Sexually transmitted disease (STD) testing.  Diabetes screening. This is done by checking your blood sugar (glucose) after you have not eaten for a while (fasting). You may have this done every 1-3 years.  Abdominal aortic aneurysm (AAA) screening. You may need this if you are a current or former smoker.  Osteoporosis. You may be screened starting at age 35 if you are at high risk. Talk with your health care provider  about your test results, treatment options, and if necessary, the need for more tests. Vaccines  Your health care provider may recommend certain vaccines, such as:  Influenza vaccine. This is recommended every year.  Tetanus, diphtheria, and acellular pertussis (Tdap,  Td) vaccine. You may need a Td booster every 10 years.  Zoster vaccine. You may need this after age 7.  Pneumococcal 13-valent conjugate (PCV13) vaccine. One dose is recommended after age 65.  Pneumococcal polysaccharide (PPSV23) vaccine. One dose is recommended after age 69. Talk to your health care provider about which screenings and vaccines you need and how often you need them. This information is not intended to replace advice given to you by your health care provider. Make sure you discuss any questions you have with your health care provider. Document Released: 07/21/2015 Document Revised: 03/13/2016 Document Reviewed: 04/25/2015 Elsevier Interactive Patient Education  2017 [employer] Prevention in the Home Falls can cause injuries. They can happen to people of all ages. There are many things you can do to make your home safe and to help prevent falls. What can I do on the outside of my home?  Regularly fix the edges of walkways and driveways and fix any cracks.  Remove anything that might make you trip as you walk through a door, such as a raised step or threshold.  Trim any bushes or trees on the path to your home.  Use bright outdoor lighting.  Clear any walking paths of anything that might make someone trip, such as rocks or tools.  Regularly check to see if handrails are loose or broken. Make sure that both sides of any steps have handrails.  Any raised decks and porches should have guardrails on the edges.  Have any leaves, snow, or ice cleared regularly.  Use sand or salt on walking paths during winter.  Clean up any spills in your garage right away. This includes oil or grease spills. What can I do in the bathroom?  Use night lights.  Install grab bars by the toilet and in the tub and shower. Do not use towel bars as grab bars.  Use non-skid mats or decals in the tub or shower.  If you need to sit down in the shower, use a plastic, non-slip  stool.  Keep the floor dry. Clean up any water that spills on the floor as soon as it happens.  Remove soap buildup in the tub or shower regularly.  Attach bath mats securely with double-sided non-slip rug tape.  Do not have throw rugs and other things on the floor that can make you trip. What can I do in the bedroom?  Use night lights.  Make sure that you have a light by your bed that is easy to reach.  Do not use any sheets or blankets that are too big for your bed. They should not hang down onto the floor.  Have a firm chair that has side arms. You can use this for support while you get dressed.  Do not have throw rugs and other things on the floor that can make you trip. What can I do in the kitchen?  Clean up any spills right away.  Avoid walking on wet floors.  Keep items that you use a lot in easy-to-reach places.  If you need to reach something above you, use a strong step stool that has a grab bar.  Keep electrical cords out of the way.  Do not use floor polish or  wax that makes floors slippery. If you must use wax, use non-skid floor wax.  Do not have throw rugs and other things on the floor that can make you trip. What can I do with my stairs?  Do not leave any items on the stairs.  Make sure that there are handrails on both sides of the stairs and use them. Fix handrails that are broken or loose. Make sure that handrails are as long as the stairways.  Check any carpeting to make sure that it is firmly attached to the stairs. Fix any carpet that is loose or worn.  Avoid having throw rugs at the top or bottom of the stairs. If you do have throw rugs, attach them to the floor with carpet tape.  Make sure that you have a light switch at the top of the stairs and the bottom of the stairs. If you do not have them, ask someone to add them for you. What else can I do to help prevent falls?  Wear shoes that:  Do not have high heels.  Have rubber bottoms.  Are  comfortable and fit you well.  Are closed at the toe. Do not wear sandals.  If you use a stepladder:  Make sure that it is fully opened. Do not climb a closed stepladder.  Make sure that both sides of the stepladder are locked into place.  Ask someone to hold it for you, if possible.  Clearly mark and make sure that you can see:  Any grab bars or handrails.  First and last steps.  Where the edge of each step is.  Use tools that help you move around (mobility aids) if they are needed. These include:  Canes.  Walkers.  Scooters.  Crutches.  Turn on the lights when you go into a dark area. Replace any light bulbs as soon as they burn out.  Set up your furniture so you have a clear path. Avoid moving your furniture around.  If any of your floors are uneven, fix them.  If there are any pets around you, be aware of where they are.  Review your medicines with your doctor. Some medicines can make you feel dizzy. This can increase your chance of falling. Ask your doctor what other things that you can do to help prevent falls. This information is not intended to replace advice given to you by your health care provider. Make sure you discuss any questions you have with your health care provider. Document Released: 04/20/2009 Document Revised: 11/30/2015 Document Reviewed: 07/29/2014 Elsevier Interactive Patient Education  2017 [employer].

## 2020-04-04 NOTE — Progress Notes (Signed)
Complete physical exam   Patient: Juan Mack   DOB: Aug 18, 1945   74 y.o. Male  MRN: [id number] Visit Date: 04/05/2020  Today's healthcare provider: Wilhemena Durie, MD   Chief Complaint  Patient presents with  . Annual Exam   Subjective    Juan Mack is a 74 y.o. male who presents today for a complete physical exam.  He reports consuming a general diet. Home exercise routine includes walking. He generally feels well. He reports sleeping well. He does not have additional problems to discuss today.   HPI   Patient had AWV with NHA on 03/30/2020.  Past Medical History:  Diagnosis Date  . Anxiety   . Anxiety disorder   . BCC (basal cell carcinoma)   . Central vein occlusion of retina   . Dermatitis   . Diabetes mellitus, type II ([place])   . Elevated PSA   . Hyperlipemia   . Hyperlipidemia   . Hypertension   . Insomnia   . Insomnia   . OSA on CPAP    not using CPAP  . Prostate nodule   . Pruritus   . Stable central retinal vein occlusion    Past Surgical History:  Procedure Laterality Date  . CATARACT EXTRACTION W/PHACO Left 12/27/2019   Procedure: CATARACT EXTRACTION PHACO AND INTRAOCULAR LENS PLACEMENT (IOC) LEFT DIABETIC 2.58  00:23.8;  Surgeon: Eulogio Bear, MD;  Location: [place];  Service: Ophthalmology;  Laterality: Left;  . COLONOSCOPY WITH PROPOFOL N/A 03/21/2016   Procedure: COLONOSCOPY WITH PROPOFOL;  Surgeon: Manya Silvas, MD;  Location[facility] ENDOSCOPY;  Service: Endoscopy;  Laterality: N/A;  . COLONOSCOPY WITH PROPOFOL N/A 11/13/2016   Procedure: COLONOSCOPY WITH PROPOFOL;  Surgeon: Manya Silvas, MD;  Location: [facility] ENDOSCOPY;  Service: Endoscopy;  Laterality: N/A;  . NO PAST SURGERIES    . TOOTH EXTRACTION     Social History   Socioeconomic History  . Marital status: Married    Spouse name: Not on file  . Number of children: 1  . Years of education: Not on file  . Highest education level: High school graduate    Occupational History  . Occupation: retired  Tobacco Use  . Smoking status: Former Smoker    Years: 14.00    Types: Cigarettes    Quit date: 07/08/1973    Years since quitting: 46.7  . Smokeless tobacco: Never Used  Vaping Use  . Vaping Use: Never used  Substance and Sexual Activity  . Alcohol use: No    Alcohol/week: 0.0 standard drinks  . Drug use: No  . Sexual activity: Not on file  Other Topics Concern  . Not on file  Social History Narrative  . Not on file   Social Determinants of Health   Financial Resource Strain: Low Risk   . Difficulty of Paying Living Expenses: Not hard at all  Food Insecurity: No Food Insecurity  . Worried About Charity fundraiser in the Last Year: Never true  . Ran Out of Food in the Last Year: Never true  Transportation Needs: No Transportation Needs  . Lack of Transportation (Medical): No  . Lack of Transportation (Non-Medical): No  Physical Activity: Insufficiently Active  . Days of Exercise per Week: 3 days  . Minutes of Exercise per Session: 20 min  Stress: No Stress Concern Present  . Feeling of Stress : Not at all  Social Connections: Moderately Isolated  . Frequency of Communication with Friends and  Family: More than three times a week  . Frequency of Social Gatherings with Friends and Family: Twice a week  . Attends Religious Services: Never  . Active Member of Clubs or Organizations: No  . Attends Archivist Meetings: Never  . Marital Status: Married  Human resources officer Violence: Not At Risk  . Fear of Current or Ex-Partner: No  . Emotionally Abused: No  . Physically Abused: No  . Sexually Abused: No   Family Status  Relation Name Status  . Brother  Alive  . Brother  Deceased at age 21  . Sister  Deceased       in her 37s  . Sister  Deceased at age 43  . Father  Deceased at age 22  . Mother  Deceased at age 28   Family History  Problem Relation Age of Onset  . Prostate cancer Brother        Father  .  Diabetes Brother   . Colon cancer Brother   . Pancreatic cancer Sister   . Lung cancer Sister   . Coronary artery disease Father   . Heart disease Father   . Congestive Heart Failure Mother   . Diabetes Mother    No Known Allergies  Patient Care Team: Jerrol Banana., MD as PCP - General (Family Medicine) Hollice Espy, MD as Consulting Physician (Urology) Eulogio Bear, MD as Consulting Physician (Ophthalmology)   Medications: Outpatient Medications Prior to Visit  Medication Sig  . amLODipine (NORVASC) 10 MG tablet TAKE 1 TABLET(10 MG) BY MOUTH DAILY  . aspirin 81 MG EC tablet Take 81 mg by mouth daily. Swallow whole.  Marland Kitchen atorvastatin (LIPITOR) 20 MG tablet TAKE 1 TABLET(20 MG) BY MOUTH DAILY  . benazepril (LOTENSIN) 40 MG tablet TAKE 1 TABLET BY MOUTH EVERY DAY  . blood glucose meter kit and supplies Dispense based on patient and insurance preference. Use once time daily as directed. (FOR ICD-E11.9).  Marland Kitchen glucose blood test strip Use as instructed  . latanoprost (XALATAN) 0.005 % ophthalmic solution Place 1 drop into both eyes at bedtime.  . metFORMIN (GLUCOPHAGE) 1000 MG tablet TAKE 1 TABLET(1000 MG) BY MOUTH TWICE DAILY  . pioglitazone (ACTOS) 45 MG tablet TAKE 1 TABLET(45 MG) BY MOUTH DAILY   No facility-administered medications prior to visit.    Review of Systems  Constitutional: Negative.   HENT: Negative.   Eyes: Negative.   Respiratory: Negative.   Cardiovascular: Negative.   Gastrointestinal: Negative.   Endocrine: Negative.   Genitourinary: Negative.   Musculoskeletal: Negative.   Skin: Negative.   Allergic/Immunologic: Negative.   Neurological: Negative.   Hematological: Negative.   Psychiatric/Behavioral: Negative.        Objective    BP 129/61 (BP Location: Right Arm, Patient Position: Sitting, Cuff Size: Normal)   Pulse 71   Temp 98.3 F (36.8 C) (Oral)   Ht 5' 11"  (1.803 m)   Wt 186 lb 9.6 oz (84.6 kg)   BMI 26.03 kg/m  BP  Readings from Last 3 Encounters:  04/05/20 129/61  12/27/19 135/65  09/29/19 (!) 144/64   Wt Readings from Last 3 Encounters:  04/05/20 186 lb 9.6 oz (84.6 kg)  12/27/19 190 lb (86.2 kg)  09/29/19 204 lb (92.5 kg)      Physical Exam Vitals reviewed.  Constitutional:      Appearance: He is well-developed.  HENT:     Head: Normocephalic and atraumatic.     Right Ear: External ear normal.  Left Ear: External ear normal.     Nose: Nose normal.  Eyes:     Conjunctiva/sclera: Conjunctivae normal.     Pupils: Pupils are equal, round, and reactive to light.  Cardiovascular:     Rate and Rhythm: Normal rate and regular rhythm.     Heart sounds: Normal heart sounds.  Pulmonary:     Effort: Pulmonary effort is normal.     Breath sounds: Normal breath sounds.  Abdominal:     General: Bowel sounds are normal.     Palpations: Abdomen is soft.  Genitourinary:    Penis: Normal.      Testes: Normal.  Musculoskeletal:     Cervical back: Normal range of motion and neck supple.  Skin:    General: Skin is warm and dry.     Comments: Mild senile purpura  Neurological:     General: No focal deficit present.     Mental Status: He is alert and oriented to person, place, and time.     Comments: Foot exam WNL.  Psychiatric:        Mood and Affect: Mood normal.        Behavior: Behavior normal.        Thought Content: Thought content normal.        Judgment: Judgment normal.       Last depression screening scores PHQ 2/9 Scores 03/30/2020 03/29/2019 08/27/2018  PHQ - 2 Score 0 0 0  PHQ- 9 Score - - -   Last fall risk screening Fall Risk  03/30/2020  Falls in the past year? 0  Number falls in past yr: 0  Injury with Fall? 0  Follow up -   Last Audit-C alcohol use screening Alcohol Use Disorder Test (AUDIT) 03/30/2020  1. How often do you have a drink containing alcohol? 0  2. How many drinks containing alcohol do you have on a typical day when you are drinking? 0  3. How often  do you have six or more drinks on one occasion? 0  AUDIT-C Score 0  Alcohol Brief Interventions/Follow-up AUDIT Score <7 follow-up not indicated   A score of 3 or more in women, and 4 or more in men indicates increased risk for alcohol abuse, EXCEPT if all of the points are from question 1   No results found for any visits on 04/05/20.  Assessment & Plan    Routine Health Maintenance and Physical Exam  Exercise Activities and Dietary recommendations Goals    . Increase water intake     Recommend increasing water intake to 4 glasses a day.       Immunization History  Administered Date(s) Administered  . Fluad Quad(high Dose 65+) 03/30/2019, 04/05/2020  . Influenza Split 05/21/2011  . Influenza, High Dose Seasonal PF 04/25/2014, 03/26/2018  . Influenza,inj,Quad PF,6+ Mos 04/07/2013  . Influenza-Unspecified 04/15/2017  . PFIZER SARS-COV-2 Vaccination 08/25/2019, 09/15/2019  . Pneumococcal Conjugate-13 12/14/2013  . Pneumococcal Polysaccharide-23 02/28/2011  . Td 07/27/2003, 02/13/2015    Health Maintenance  Topic Date Due  . HEMOGLOBIN A1C  03/31/2020  . FOOT EXAM  09/28/2020  . OPHTHALMOLOGY EXAM  11/02/2020  . COLONOSCOPY  11/13/2021  . TETANUS/TDAP  02/12/2025  . INFLUENZA VACCINE  Completed  . COVID-19 Vaccine  Completed  . Hepatitis C Screening  Completed  . PNA vac Low Risk Adult  Completed    Discussed health benefits of physical activity, and encouraged him to engage in regular exercise appropriate for his age and condition.  1. Annual physical exam Up-to-date  2. Need for immunization against influenza  - Flu Vaccine QUAD High Dose(Fluad)  3. Type 2 diabetes mellitus without complication, without long-term current use of insulin (HCC)  - CBC with Differential/Platelet - Comprehensive metabolic panel - TSH - Lipid panel - Hemoglobin A1c  4. Essential (primary) hypertension  - CBC with Differential/Platelet - Comprehensive metabolic panel - TSH -  Lipid panel - Hemoglobin A1c  5. Pure hypercholesterolemia  - CBC with Differential/Platelet - Comprehensive metabolic panel - TSH - Lipid panel - Hemoglobin A1c   Return in about 6 months (around 10/03/2020).     I, Wilhemena Durie, MD, have reviewed all documentation for this visit. The documentation on 04/06/20 for the exam, diagnosis, procedures, and orders are all accurate and complete.    Ranjit Ashurst Cranford Mon, MD  [facility] [phone] (phone) [phone] (fax)  [place]

## 2020-04-05 ENCOUNTER — Other Ambulatory Visit: Payer: Self-pay

## 2020-04-05 ENCOUNTER — Ambulatory Visit (INDEPENDENT_AMBULATORY_CARE_PROVIDER_SITE_OTHER): Payer: PPO | Admitting: Family Medicine

## 2020-04-05 ENCOUNTER — Encounter: Payer: Self-pay | Admitting: Family Medicine

## 2020-04-05 VITALS — BP 129/61 | HR 71 | Temp 98.3°F | Ht 71.0 in | Wt 186.6 lb

## 2020-04-05 DIAGNOSIS — I10 Essential (primary) hypertension: Secondary | ICD-10-CM

## 2020-04-05 DIAGNOSIS — E7800 Pure hypercholesterolemia, unspecified: Secondary | ICD-10-CM | POA: Diagnosis not present

## 2020-04-05 DIAGNOSIS — E119 Type 2 diabetes mellitus without complications: Secondary | ICD-10-CM | POA: Diagnosis not present

## 2020-04-05 DIAGNOSIS — Z23 Encounter for immunization: Secondary | ICD-10-CM | POA: Diagnosis not present

## 2020-04-05 DIAGNOSIS — Z0000 Encounter for general adult medical examination without abnormal findings: Secondary | ICD-10-CM | POA: Diagnosis not present

## 2020-04-06 ENCOUNTER — Telehealth: Payer: Self-pay

## 2020-04-06 LAB — CBC WITH DIFFERENTIAL/PLATELET
Basophils Absolute: 0 10*3/uL (ref 0.0–0.2)
Basos: 1 %
EOS (ABSOLUTE): 0.1 10*3/uL (ref 0.0–0.4)
Eos: 2 %
Hematocrit: 35.6 % — ABNORMAL LOW (ref 37.5–51.0)
Hemoglobin: 11.9 g/dL — ABNORMAL LOW (ref 13.0–17.7)
Immature Grans (Abs): 0 10*3/uL (ref 0.0–0.1)
Immature Granulocytes: 0 %
Lymphocytes Absolute: 2.6 10*3/uL (ref 0.7–3.1)
Lymphs: 29 %
MCH: 32.1 pg (ref 26.6–33.0)
MCHC: 33.4 g/dL (ref 31.5–35.7)
MCV: 96 fL (ref 79–97)
Monocytes Absolute: 0.7 10*3/uL (ref 0.1–0.9)
Monocytes: 8 %
Neutrophils Absolute: 5.4 10*3/uL (ref 1.4–7.0)
Neutrophils: 60 %
Platelets: 276 10*3/uL (ref 150–450)
RBC: 3.71 x10E6/uL — ABNORMAL LOW (ref 4.14–5.80)
RDW: 13.4 % (ref 11.6–15.4)
WBC: 8.9 10*3/uL (ref 3.4–10.8)

## 2020-04-06 LAB — COMPREHENSIVE METABOLIC PANEL
ALT: 9 IU/L (ref 0–44)
AST: 10 IU/L (ref 0–40)
Albumin/Globulin Ratio: 1.6 (ref 1.2–2.2)
Albumin: 4.3 g/dL (ref 3.7–4.7)
Alkaline Phosphatase: 52 IU/L (ref 44–121)
BUN/Creatinine Ratio: 13 (ref 10–24)
BUN: 17 mg/dL (ref 8–27)
Bilirubin Total: 0.6 mg/dL (ref 0.0–1.2)
CO2: 19 mmol/L — ABNORMAL LOW (ref 20–29)
Calcium: 9.7 mg/dL (ref 8.6–10.2)
Chloride: 100 mmol/L (ref 96–106)
Creatinine, Ser: 1.34 mg/dL — ABNORMAL HIGH (ref 0.76–1.27)
GFR calc Af Amer: 60 mL/min/{1.73_m2} (ref >59)
GFR calc non Af Amer: 52 mL/min/{1.73_m2} — ABNORMAL LOW (ref >59)
Globulin, Total: 2.7 g/dL (ref 1.5–4.5)
Glucose: 139 mg/dL — ABNORMAL HIGH (ref 65–99)
Potassium: 5.2 mmol/L (ref 3.5–5.2)
Sodium: 136 mmol/L (ref 134–144)
Total Protein: 7 g/dL (ref 6.0–8.5)

## 2020-04-06 LAB — HEMOGLOBIN A1C
Est. average glucose Bld gHb Est-mCnc: 143 mg/dL
Hgb A1c MFr Bld: 6.6 % — ABNORMAL HIGH (ref 4.8–5.6)

## 2020-04-06 LAB — TSH: TSH: 1.63 u[IU]/mL (ref 0.450–4.500)

## 2020-04-06 LAB — LIPID PANEL
Chol/HDL Ratio: 2.9 ratio (ref 0.0–5.0)
Cholesterol, Total: 106 mg/dL (ref 100–199)
HDL: 36 mg/dL — ABNORMAL LOW (ref >39)
LDL Chol Calc (NIH): 50 mg/dL (ref 0–99)
Triglycerides: 105 mg/dL (ref 0–149)
VLDL Cholesterol Cal: 20 mg/dL (ref 5–40)

## 2020-04-06 NOTE — Telephone Encounter (Signed)
Patient advised of lab results via mychart and has read the providers comments.

## 2020-04-06 NOTE — Telephone Encounter (Signed)
-----   Message from Jerrol Banana., MD sent at 04/06/2020  9:18 AM EDT ----- Labs stable.  Stay hydrated to protect kidneys.

## 2020-05-10 ENCOUNTER — Other Ambulatory Visit: Payer: Self-pay | Admitting: Family Medicine

## 2020-05-10 NOTE — Telephone Encounter (Signed)
Requested Prescriptions  Pending Prescriptions Disp Refills  . benazepril (LOTENSIN) 40 MG tablet [Pharmacy Med Name: BENAZEPRIL 40MG  TABLETS] 90 tablet 1    Sig: TAKE 1 TABLET BY MOUTH EVERY DAY     Cardiovascular:  ACE Inhibitors Failed - 05/10/2020  3:13 AM      Failed - Cr in normal range and within 180 days    Creatinine, Ser  Date Value Ref Range Status  04/05/2020 1.34 (H) 0.76 - 1.27 mg/dL Final         Failed - Valid encounter within last 6 months    Recent Outpatient Visits          1 month ago Annual physical exam   [facility] Jerrol Banana., MD   7 months ago Type 2 diabetes mellitus without complication, without long-term current use of insulin [facility])   [facility] Jerrol Banana., MD   1 year ago Annual physical exam   [facility] Jerrol Banana., MD   1 year ago Type 2 diabetes mellitus without complication, without long-term current use of insulin [facility])   [facility] Jerrol Banana., MD   2 years ago Encounter for annual physical exam   [facility] Jerrol Banana., MD      Future Appointments            In 4 months Hollice Espy, MD [place]   In 4 months Jerrol Banana., MD [facility]           Passed - K in normal range and within 180 days    Potassium  Date Value Ref Range Status  04/05/2020 5.2 3.5 - 5.2 mmol/L Final         Passed - Patient is not pregnant      Passed - Last BP in normal range    BP Readings from Last 1 Encounters:  04/05/20 129/61         Valid encounter one month ago.

## 2020-05-24 ENCOUNTER — Other Ambulatory Visit: Payer: Self-pay | Admitting: Family Medicine

## 2020-05-24 DIAGNOSIS — I10 Essential (primary) hypertension: Secondary | ICD-10-CM

## 2020-05-24 DIAGNOSIS — E119 Type 2 diabetes mellitus without complications: Secondary | ICD-10-CM

## 2020-06-20 ENCOUNTER — Other Ambulatory Visit: Payer: Self-pay | Admitting: Family Medicine

## 2020-06-22 ENCOUNTER — Ambulatory Visit (INDEPENDENT_AMBULATORY_CARE_PROVIDER_SITE_OTHER): Payer: PPO

## 2020-06-22 ENCOUNTER — Other Ambulatory Visit: Payer: Self-pay

## 2020-06-22 DIAGNOSIS — Z23 Encounter for immunization: Secondary | ICD-10-CM

## 2020-08-11 DIAGNOSIS — H401131 Primary open-angle glaucoma, bilateral, mild stage: Secondary | ICD-10-CM | POA: Diagnosis not present

## 2020-08-11 LAB — HM DIABETES EYE EXAM

## 2020-09-08 ENCOUNTER — Other Ambulatory Visit: Payer: Self-pay | Admitting: *Deleted

## 2020-09-08 DIAGNOSIS — C61 Malignant neoplasm of prostate: Secondary | ICD-10-CM

## 2020-09-11 ENCOUNTER — Other Ambulatory Visit: Payer: PPO

## 2020-09-11 ENCOUNTER — Other Ambulatory Visit: Payer: Self-pay

## 2020-09-11 DIAGNOSIS — C61 Malignant neoplasm of prostate: Secondary | ICD-10-CM | POA: Diagnosis not present

## 2020-09-12 ENCOUNTER — Encounter: Payer: Self-pay | Admitting: Urology

## 2020-09-12 ENCOUNTER — Ambulatory Visit: Payer: PPO | Admitting: Urology

## 2020-09-12 VITALS — BP 161/67 | HR 78 | Ht 71.0 in | Wt 185.0 lb

## 2020-09-12 DIAGNOSIS — C61 Malignant neoplasm of prostate: Secondary | ICD-10-CM | POA: Diagnosis not present

## 2020-09-12 DIAGNOSIS — R6889 Other general symptoms and signs: Secondary | ICD-10-CM | POA: Diagnosis not present

## 2020-09-12 LAB — PSA: Prostate Specific Ag, Serum: 2.7 ng/mL (ref 0.0–4.0)

## 2020-09-12 NOTE — Progress Notes (Signed)
09/12/2020 3:10 PM   Juan Mack 18-May-1946 [id number]  Referring provider: Jerrol Banana., MD [address] [place],  [place] [address]  Chief Complaint  Patient presents with  . Prostate Cancer    1year follow up    HPI: 75 year old male with personal history of prostate cancer returns today for routine surveillance.  Diagnosed 11/2014 with low risk prostate cancer. Gleason score 6(3+3)in the right mid lateral 1/2 biopsies 3% with 4.0 PSA.   Endorectal MRI in 01/2016 was unremarkable.  Currently on active surveillance. PSA remained stable around 2-2.5 ng/dL. PSAslightlyelevated at 3.0 on 08/25/2018. Most recent PSA decreased to 2.5 on 08/27/2019.   No issues with urinary symptoms.    PSA Trend 2.4 on 06/12/2015 2.3 on 12/15/2015 2.0 on 06/25/2016 2.2 on 12/24/2016 2.8 on 07/17/2017 2.4 on 01/19/2018 3.0 on 08/25/2018 2.5 on 08/27/2019 2.7 on 09/11/20  PMH: Past Medical History:  Diagnosis Date  . Anxiety   . Anxiety disorder   . BCC (basal cell carcinoma)   . Central vein occlusion of retina   . Dermatitis   . Diabetes mellitus, type II ([place])   . Elevated PSA   . Hyperlipemia   . Hyperlipidemia   . Hypertension   . Insomnia   . Insomnia   . OSA on CPAP    not using CPAP  . Prostate nodule   . Pruritus   . Stable central retinal vein occlusion     Surgical History: Past Surgical History:  Procedure Laterality Date  . CATARACT EXTRACTION W/PHACO Left 12/27/2019   Procedure: CATARACT EXTRACTION PHACO AND INTRAOCULAR LENS PLACEMENT (IOC) LEFT DIABETIC 2.58  00:23.8;  Surgeon: Eulogio Bear, MD;  Location: [place];  Service: Ophthalmology;  Laterality: Left;  . COLONOSCOPY WITH PROPOFOL N/A 03/21/2016   Procedure: COLONOSCOPY WITH PROPOFOL;  Surgeon: Manya Silvas, MD;  Location: [facility] ENDOSCOPY;  Service: Endoscopy;  Laterality: N/A;  . COLONOSCOPY WITH PROPOFOL N/A 11/13/2016   Procedure: COLONOSCOPY WITH PROPOFOL;   Surgeon: Manya Silvas, MD;  [facility] END[facility]COPY;  Service: Endoscopy;  Laterality: N/A;  . NO PAST SURGERIES    . TOOTH EXTRACTION      Home Medications:  Allergies as of 09/12/2020   No Known Allergies     Medication List       Accurate as of September 12, 2020  3:10 PM. If you have any questions, ask your nurse or doctor.        amLODipine 10 MG tablet Commonly known as: NORVASC TAKE 1 TABLET(10 MG) BY MOUTH DAILY   aspirin 81 MG EC tablet Take 81 mg by mouth daily. Swallow whole.   atorvastatin 20 MG tablet Commonly known as: LIPITOR TAKE 1 TABLET(20 MG) BY MOUTH DAILY   benazepril 40 MG tablet Commonly known as: LOTENSIN TAKE 1 TABLET BY MOUTH EVERY DAY   blood glucose meter kit and supplies Dispense based on patient and insurance preference. Use once time daily as directed. (FOR ICD-E11.9).   glucose blood test strip Use as instructed   latanoprost 0.005 % ophthalmic solution Commonly known as: XALATAN Place 1 drop into both eyes at bedtime.   metFORMIN 1000 MG tablet Commonly known as: GLUCOPHAGE TAKE 1 TABLET(1000 MG) BY MOUTH TWICE DAILY   pioglitazone 45 MG tablet Commonly known as: ACTOS TAKE 1 TABLET(45 MG) BY MOUTH DAILY       Allergies: No Known Allergies  Family History: Family History  Problem Relation Age of Onset  .  Prostate cancer Brother        Father  . Diabetes Brother   . Colon cancer Brother   . Pancreatic cancer Sister   . Lung cancer Sister   . Coronary artery disease Father   . Heart disease Father   . Congestive Heart Failure Mother   . Diabetes Mother     Social History:  reports that he quit smoking about 47 years ago. His smoking use included cigarettes. He quit after 14.00 years of use. He has never used smokeless tobacco. He reports that he does not drink alcohol and does not use drugs.   Physical Exam: BP (!) 161/67   Pulse 78   Ht 5' 11"  (1.803 m)   Wt 185 lb (83.9 kg)   BMI 25.80 kg/m    Constitutional:  Alert and oriented, No acute distress. HEENT: [place] AT, moist mucus membranes.  Trachea midline, no masses. Cardiovascular: No clubbing, cyanosis, or edema. Respiratory: Normal respiratory effort, no increased work of breathing. Rectal: Normal sphincter tone.  Enlarged prostate with firm induration on the left compared to the right, unchanged from previous exams Neurologic: Grossly intact, no focal deficits, moving all 4 extremities. Psychiatric: Normal mood and affect.  Laboratory Data: Lab Results  Component Value Date   WBC 8.9 04/05/2020   HGB 11.9 (L) 04/05/2020   HCT 35.6 (L) 04/05/2020   MCV 96 04/05/2020   PLT 276 04/05/2020    Lab Results  Component Value Date   CREATININE 1.34 (H) 04/05/2020    Assessment & Plan:    1. Prostate cancer [facility]) Personal history of low risk prostate cancer on active surveillance with essentially stable PSA although there is been no changes rectal exam  We will continue to trend his PSA, if it is or to trend upwards, low threshold to consider repeat biopsy versus MRI  We will have him follow-up in 6 months with PSA prior - PSA; Future  2. Abnormal digital rectal exam As above   Hollice Espy, MD  [facility] [address], [place],  [address] [phone]

## 2020-09-17 ENCOUNTER — Other Ambulatory Visit: Payer: Self-pay | Admitting: Family Medicine

## 2020-09-17 NOTE — Telephone Encounter (Signed)
Requested Prescriptions  Pending Prescriptions Disp Refills  . metFORMIN (GLUCOPHAGE) 1000 MG tablet [Pharmacy Med Name: METFORMIN 1000MG TABLETS] 180 tablet 0    Sig: TAKE 1 TABLET(1000 MG) BY MOUTH TWICE DAILY     Endocrinology:  Diabetes - Biguanides Failed - 09/17/2020  7:46 AM      Failed - Cr in normal range and within 360 days    Creatinine, Ser  Date Value Ref Range Status  04/05/2020 1.34 (H) 0.76 - 1.27 mg/dL Final         Failed - eGFR in normal range and within 360 days    GFR calc Af Amer  Date Value Ref Range Status  04/05/2020 60 >59 mL/min/1.73 Final    Comment:    **[facility] currently reports eGFR in compliance with the current**   recommendations of the [employer]. [facility] will   update reporting as new guidelines are published from the NKF-ASN   Task force.    GFR calc non Af Amer  Date Value Ref Range Status  04/05/2020 52 (L) >59 mL/min/1.73 Final         Failed - Valid encounter within last 6 months    Recent Outpatient Visits          5 months ago Annual physical exam   [facility] Jerrol Banana., MD   11 months ago Type 2 diabetes mellitus without complication, without long-term current use of insulin ([place])   [facility] Jerrol Banana., MD   1 year ago Annual physical exam   [facility] Jerrol Banana., MD   2 years ago Type 2 diabetes mellitus without complication, without long-term current use of insulin [facility])   [facility] Jerrol Banana., MD   2 years ago Encounter for annual physical exam   [facility] Jerrol Banana., MD      Future Appointments            In 2 weeks Jerrol Banana., MD [facility] [address], [place]   In 5 months Hollice Espy, MD [facility]           Passed - HBA1C is between 0 and 7.9 and within 180 days    Hgb A1c MFr Bld  Date Value Ref Range Status   04/05/2020 6.6 (H) 4.8 - 5.6 % Final    Comment:             Prediabetes: 5.7 - 6.4          Diabetes: >6.4          Glycemic control for adults with diabetes: <7.0          . atorvastatin (LIPITOR) 20 MG tablet [Pharmacy Med Name: ATORVASTATIN 20MG TABLETS] 90 tablet 1    Sig: TAKE 1 TABLET(20 MG) BY MOUTH DAILY     Cardiovascular:  Antilipid - Statins Failed - 09/17/2020  7:46 AM      Failed - LDL in normal range and within 360 days    LDL Chol Calc (NIH)  Date Value Ref Range Status  04/05/2020 50 0 - 99 mg/dL Final         Failed - HDL in normal range and within 360 days    HDL  Date Value Ref Range Status  04/05/2020 36 (L) >39 mg/dL Final         Passed - Total Cholesterol in normal range and within 360 days  Cholesterol, Total  Date Value Ref Range Status  04/05/2020 106 100 - 199 mg/dL Final         Passed - Triglycerides in normal range and within 360 days    Triglycerides  Date Value Ref Range Status  04/05/2020 105 0 - 149 mg/dL Final         Passed - Patient is not pregnant      Passed - Valid encounter within last 12 months    Recent Outpatient Visits          5 months ago Annual physical exam   [facility] Jerrol Banana., MD   11 months ago Type 2 diabetes mellitus without complication, without long-term current use of insulin [facility])   [facility] Jerrol Banana., MD   1 year ago Annual physical exam   [facility] Jerrol Banana., MD   2 years ago Type 2 diabetes mellitus without complication, without long-term current use of insulin [facility])   [facility] Jerrol Banana., MD   2 years ago Encounter for annual physical exam   [facility] Jerrol Banana., MD      Future Appointments            In 2 weeks Jerrol Banana., MD [facility] [place]   In 5 months Hollice Espy, [facility]

## 2020-10-02 NOTE — Progress Notes (Signed)
Established patient visit   Patient: Juan Mack   DOB: [birth year]   75 y.o. Male  MRN: [id number] Visit Date: 10/03/2020  Today's healthcare provider: Wilhemena Durie, MD   Chief Complaint  Patient presents with  . Diabetes   Subjective    HPI  Diabetes Mellitus Type II, follow-up  Lab Results  Component Value Date   HGBA1C 6.5 (A) 10/03/2020   HGBA1C 6.6 (H) 04/05/2020   HGBA1C 6.6 (A) 09/29/2019   Last seen for diabetes 6 months ago.  Management since then includes continuing the same treatment. He reports good compliance with treatment. He is not having side effects.   Home blood sugar records: fasting range: 120s  Episodes of hypoglycemia? No    Current insulin regiment: none Most Recent Eye Exam: 11/03/2019  Hypertension, follow-up  BP Readings from Last 3 Encounters:  10/03/20 (!) 164/70  09/12/20 (!) 161/67  04/05/20 129/61   Wt Readings from Last 3 Encounters:  10/03/20 195 lb (88.5 kg)  09/12/20 185 lb (83.9 kg)  04/05/20 186 lb 9.6 oz (84.6 kg)     He was last seen for hypertension 6 months ago.  BP at that visit was 129/61. Management since that visit includes no changes. Continue current medication.  He reports good compliance with treatment. He is not having side effects.  He is not exercising. He is adherent to low salt diet.   Outside blood pressures are checked daily. Averages 140s/70s at home.   He does not smoke.  Use of agents associated with hypertension: none.   Lipid/Cholesterol, follow-up  Last Lipid Panel: Lab Results  Component Value Date   CHOL 106 04/05/2020   LDLCALC 50 04/05/2020   HDL 36 (L) 04/05/2020   TRIG 105 04/05/2020    He was last seen for this 6 months ago.  Management since that visit includes no changes.  He reports good compliance with treatment. He is not having side effects.   Last metabolic panel Lab Results  Component Value Date   GLUCOSE 139 (H) 04/05/2020   NA 136 04/05/2020    K 5.2 04/05/2020   BUN 17 04/05/2020   CREATININE 1.34 (H) 04/05/2020   GFRNONAA 52 (L) 04/05/2020   GFRAA 60 04/05/2020   CALCIUM 9.7 04/05/2020   AST 10 04/05/2020   ALT 9 04/05/2020   The ASCVD Risk score (Goff DC Jr., et al., 2013) failed to calculate for the following reasons:   The valid total cholesterol range is 130 to 320 mg/dL      Medications: Outpatient Medications Prior to Visit  Medication Sig  . amLODipine (NORVASC) 10 MG tablet TAKE 1 TABLET(10 MG) BY MOUTH DAILY  . aspirin 81 MG EC tablet Take 81 mg by mouth daily. Swallow whole.  Marland Kitchen atorvastatin (LIPITOR) 20 MG tablet TAKE 1 TABLET(20 MG) BY MOUTH DAILY  . benazepril (LOTENSIN) 40 MG tablet TAKE 1 TABLET BY MOUTH EVERY DAY  . blood glucose meter kit and supplies Dispense based on patient and insurance preference. Use once time daily as directed. (FOR ICD-E11.9).  Marland Kitchen glucose blood test strip Use as instructed  . latanoprost (XALATAN) 0.005 % ophthalmic solution Place 1 drop into both eyes at bedtime.  . metFORMIN (GLUCOPHAGE) 1000 MG tablet TAKE 1 TABLET(1000 MG) BY MOUTH TWICE DAILY  . pioglitazone (ACTOS) 45 MG tablet TAKE 1 TABLET(45 MG) BY MOUTH DAILY   No facility-administered medications prior to visit.    Review of Systems  Constitutional: Negative  for activity change and fatigue.  Respiratory: Negative for cough and shortness of breath.   Cardiovascular: Negative for chest pain, palpitations and leg swelling.  Endocrine: Negative for cold intolerance, heat intolerance, polydipsia, polyphagia and polyuria.  Neurological: Negative for dizziness, light-headedness and headaches.        Objective    BP (!) 164/70   Pulse 78   Temp 98.4 F (36.9 C)   Resp 16   Ht 5' 11"  (5.625 m)   Wt 195 lb (88.5 kg)   BMI 27.20 kg/m  BP Readings from Last 3 Encounters:  10/03/20 (!) 164/70  09/12/20 (!) 161/67  04/05/20 129/61   Wt Readings from Last 3 Encounters:  10/03/20 195 lb (88.5 kg)  09/12/20 185  lb (83.9 kg)  04/05/20 186 lb 9.6 oz (84.6 kg)       Physical Exam Vitals reviewed.  Constitutional:      Appearance: Normal appearance. He is well-developed.  HENT:     Head: Normocephalic and atraumatic.     Right Ear: External ear normal.     Left Ear: External ear normal.     Nose: Nose normal.  Eyes:     Conjunctiva/sclera: Conjunctivae normal.     Pupils: Pupils are equal, round, and reactive to light.  Cardiovascular:     Rate and Rhythm: Normal rate and regular rhythm.     Heart sounds: Normal heart sounds.  Pulmonary:     Effort: Pulmonary effort is normal.     Breath sounds: Normal breath sounds.  Abdominal:     General: Bowel sounds are normal.     Palpations: Abdomen is soft.  Musculoskeletal:     Cervical back: Normal range of motion and neck supple.  Skin:    General: Skin is warm and dry.  Neurological:     General: No focal deficit present.     Mental Status: He is alert and oriented to person, place, and time.     Comments: Normal diabetic monofilament exam.  Psychiatric:        Mood and Affect: Mood normal.        Behavior: Behavior normal.        Thought Content: Thought content normal.        Judgment: Judgment normal.       Results for orders placed or performed in visit on 10/03/20  POCT glycosylated hemoglobin (Hb A1C)  Result Value Ref Range   Hemoglobin A1C 6.5 (A) 4.0 - 5.6 %   HbA1c POC (<> result, manual entry)     HbA1c, POC (prediabetic range)     HbA1c, POC (controlled diabetic range)      Assessment & Plan     1. Type 2 diabetes mellitus without complication, without long-term current use of insulin (HCC) Good control with A1c of 6.5. - POCT glycosylated hemoglobin (Hb A1C)  2. Essential (primary) hypertension Good control.  Check blood pressures at home.  Bring in readings on next visit.  3. Pure hypercholesterolemia On atorvastatin 20   No follow-ups on file.      I, Wilhemena Durie, MD, have reviewed all  documentation for this visit. The documentation on 10/15/20 for the exam, diagnosis, procedures, and orders are all accurate and complete.    Lilymarie Scroggins Cranford Mon, MD  [facility] [phone] (phone) [phone] (fax)  [place]

## 2020-10-03 ENCOUNTER — Ambulatory Visit (INDEPENDENT_AMBULATORY_CARE_PROVIDER_SITE_OTHER): Payer: PPO | Admitting: Family Medicine

## 2020-10-03 ENCOUNTER — Other Ambulatory Visit: Payer: Self-pay

## 2020-10-03 ENCOUNTER — Encounter: Payer: Self-pay | Admitting: Family Medicine

## 2020-10-03 VITALS — BP 164/70 | HR 78 | Temp 98.4°F | Resp 16 | Ht 71.0 in | Wt 195.0 lb

## 2020-10-03 DIAGNOSIS — E119 Type 2 diabetes mellitus without complications: Secondary | ICD-10-CM

## 2020-10-03 DIAGNOSIS — I10 Essential (primary) hypertension: Secondary | ICD-10-CM

## 2020-10-03 DIAGNOSIS — E7800 Pure hypercholesterolemia, unspecified: Secondary | ICD-10-CM | POA: Diagnosis not present

## 2020-10-03 LAB — POCT GLYCOSYLATED HEMOGLOBIN (HGB A1C): Hemoglobin A1C: 6.5 % — AB (ref 4.0–5.6)

## 2020-10-31 IMAGING — MR MR PROSTATE WO/W CM
56 series · 56 of 56 positions shown · IV contrast (Gadavist)
Comparison: 01/12/2016

CLINICAL DATA: Prostate carcinoma, Gleason score 3+3=6. Active
surveillance.

EXAM:
MR PROSTATE WITHOUT AND WITH CONTRAST
TECHNIQUE: Multiplanar multisequence MRI images were obtained of the pelvis
centered about the prostate. Pre and post contrast images were
obtained.
CONTRAST:  9 mL Gadavist

[Series 3: T1 · axial · 8.0mm · 0.74mm/px · 1 of 25 slices shown (1 of 48)]
[im 1/25]
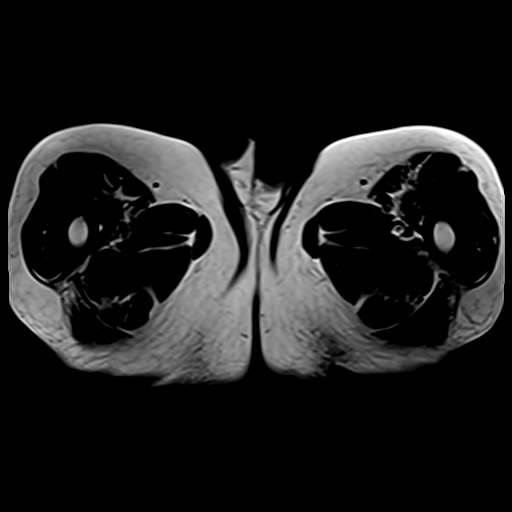

[Series 4: bSSFP fat-sat · axial · 8.0mm · 0.74mm/px · 1 of 25 slices shown]
[im 1/25]
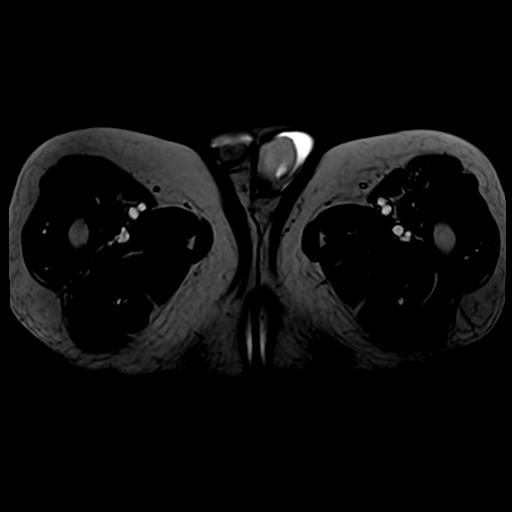

[Series 5: T2 · coronal · 3.0mm · 0.70mm/px · 1 of 32 slices shown (1 of 3)]
[im 1/32]
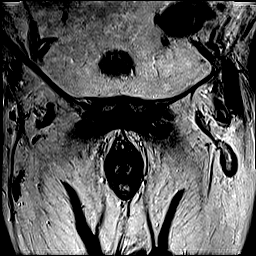

[Series 6: T2 · sagittal · 3.5mm · 0.62mm/px · 1 of 35 slices shown (2 of 3)]
[im 1/35]
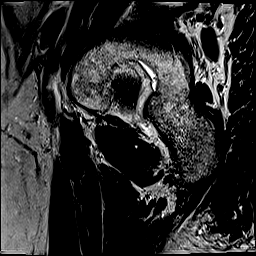

[Series 7: T1 · axial · 3.0mm · 0.35mm/px · 1 of 30 slices shown (2 of 48)]
[im 1/30]
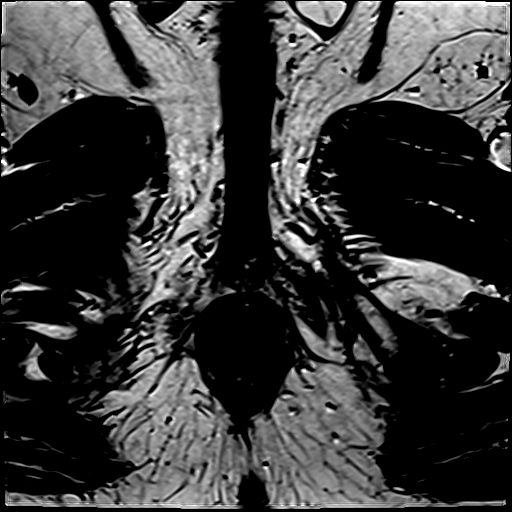

[Series 8: T2 · axial · 3.5mm · 0.56mm/px · 1 of 25 slices shown (3 of 3)]
[im 1/25]
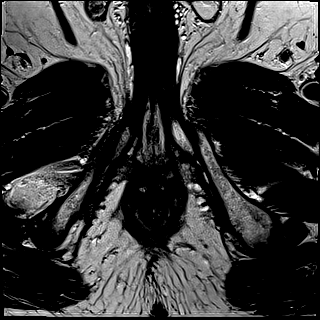

[Series 9: ax dwi_tracew · axial · 3.0mm · 0.78mm/px · 1 of 75 slices shown]
[im 1/75]
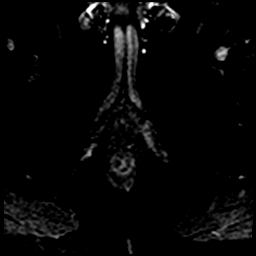

[Series 10: ax dwi_adc · axial · 3.0mm · 0.78mm/px · 1 of 25 slices shown]
[im 1/25]
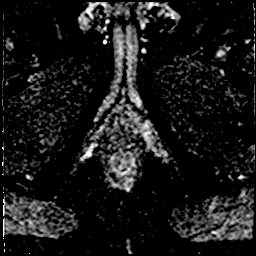

[Series 11: ax dwi_calc_bval · axial · 3.0mm · 0.78mm/px · 1 of 25 slices shown]
[im 1/25]
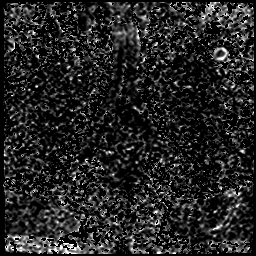

[Series 12: t2_[id number]_288 · axial · 1.0mm · 1.04mm/px · 1 of 72 slices shown]
[im 1/72]
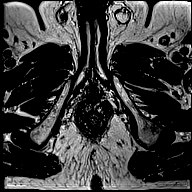

[Series 13: T1 · axial · 3.0mm · 1.15mm/px · 1 of 28 slices shown (3 of 48)]
[im 1/28]
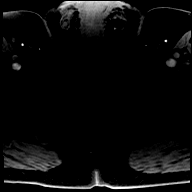

[Series 14: T1 · axial · 3.0mm · 1.15mm/px · 1 of 28 slices shown (4 of 48)]
[im 1/28]
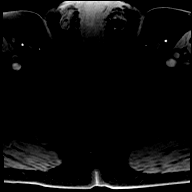

[Series 15: T1 · axial · 3.0mm · 1.15mm/px · 1 of 28 slices shown (5 of 48)]
[im 1/28]
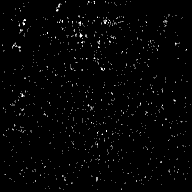

[Series 16: T1 · axial · 3.0mm · 1.15mm/px · 1 of 28 slices shown (6 of 48)]
[im 1/28]
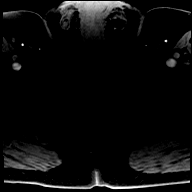

[Series 17: T1 · axial · 3.0mm · 1.15mm/px · 1 of 28 slices shown (7 of 48)]
[im 1/28]
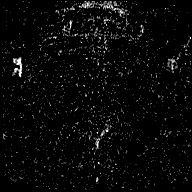

[Series 18: T1 · axial · 3.0mm · 1.15mm/px · 1 of 28 slices shown (8 of 48)]
[im 1/28]
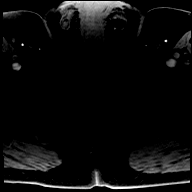

[Series 19: T1 · axial · 3.0mm · 1.15mm/px · 1 of 28 slices shown (9 of 48)]
[im 1/28]
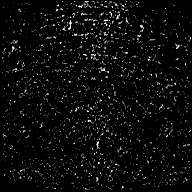

[Series 20: T1 · axial · 3.0mm · 1.15mm/px · 1 of 28 slices shown (10 of 48)]
[im 1/28]
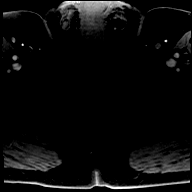

[Series 21: T1 · axial · 3.0mm · 1.15mm/px · 1 of 28 slices shown (11 of 48)]
[im 1/28]
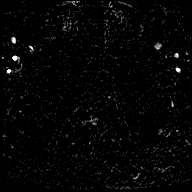

[Series 22: T1 · axial · 3.0mm · 1.15mm/px · 1 of 28 slices shown (12 of 48)]
[im 1/28]
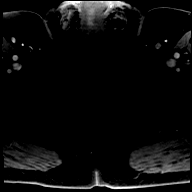

[Series 23: T1 · axial · 3.0mm · 1.15mm/px · 1 of 28 slices shown (13 of 48)]
[im 1/28]
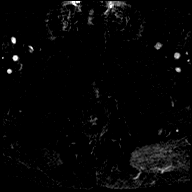

[Series 24: T1 · axial · 3.0mm · 1.15mm/px · 1 of 28 slices shown (14 of 48)]
[im 1/28]
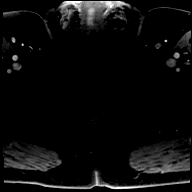

[Series 25: T1 · axial · 3.0mm · 1.15mm/px · 1 of 28 slices shown (15 of 48)]
[im 1/28]
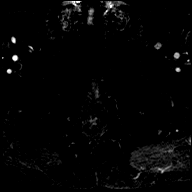

[Series 26: T1 · axial · 3.0mm · 1.15mm/px · 1 of 28 slices shown (16 of 48)]
[im 1/28]
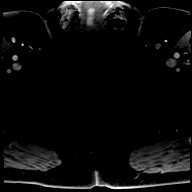

[Series 27: T1 · axial · 3.0mm · 1.15mm/px · 1 of 28 slices shown (17 of 48)]
[im 1/28]
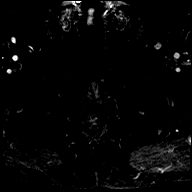

[Series 28: T1 · axial · 3.0mm · 1.15mm/px · 1 of 28 slices shown (18 of 48)]
[im 1/28]
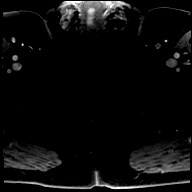

[Series 29: T1 · axial · 3.0mm · 1.15mm/px · 1 of 28 slices shown (19 of 48)]
[im 1/28]
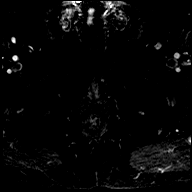

[Series 30: T1 · axial · 3.0mm · 1.15mm/px · 1 of 28 slices shown (20 of 48)]
[im 1/28]
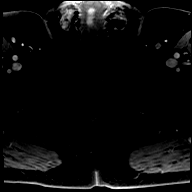

[Series 31: T1 · axial · 3.0mm · 1.15mm/px · 1 of 28 slices shown (21 of 48)]
[im 1/28]
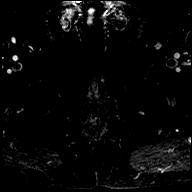

[Series 32: T1 · axial · 3.0mm · 1.15mm/px · 1 of 28 slices shown (22 of 48)]
[im 1/28]
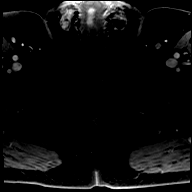

[Series 33: T1 · axial · 3.0mm · 1.15mm/px · 1 of 28 slices shown (23 of 48)]
[im 1/28]
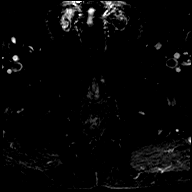

[Series 34: T1 · axial · 3.0mm · 1.15mm/px · 1 of 28 slices shown (24 of 48)]
[im 1/28]
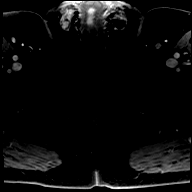

[Series 35: T1 · axial · 3.0mm · 1.15mm/px · 1 of 28 slices shown (25 of 48)]
[im 1/28]
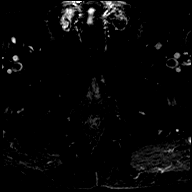

[Series 36: T1 · axial · 3.0mm · 1.15mm/px · 1 of 28 slices shown (26 of 48)]
[im 1/28]
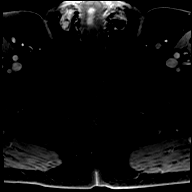

[Series 37: T1 · axial · 3.0mm · 1.15mm/px · 1 of 28 slices shown (27 of 48)]
[im 1/28]
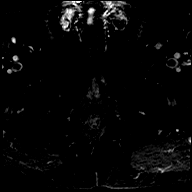

[Series 38: T1 · axial · 3.0mm · 1.15mm/px · 1 of 28 slices shown (28 of 48)]
[im 1/28]
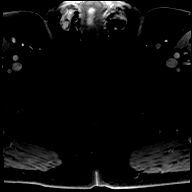

[Series 39: T1 · axial · 3.0mm · 1.15mm/px · 1 of 28 slices shown (29 of 48)]
[im 1/28]
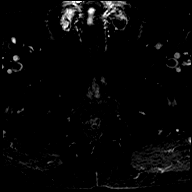

[Series 40: T1 · axial · 3.0mm · 1.15mm/px · 1 of 28 slices shown (30 of 48)]
[im 1/28]
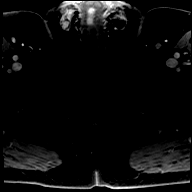

[Series 41: T1 · axial · 3.0mm · 1.15mm/px · 1 of 28 slices shown (31 of 48)]
[im 1/28]
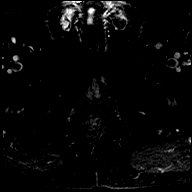

[Series 42: T1 · axial · 3.0mm · 1.15mm/px · 1 of 28 slices shown (32 of 48)]
[im 1/28]
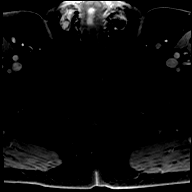

[Series 43: T1 · axial · 3.0mm · 1.15mm/px · 1 of 28 slices shown (33 of 48)]
[im 1/28]
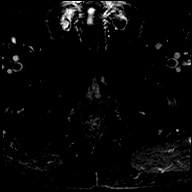

[Series 44: T1 · axial · 3.0mm · 1.15mm/px · 1 of 28 slices shown (34 of 48)]
[im 1/28]
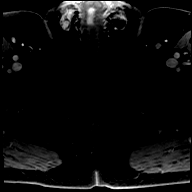

[Series 45: T1 · axial · 3.0mm · 1.15mm/px · 1 of 28 slices shown (35 of 48)]
[im 1/28]
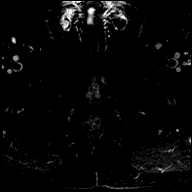

[Series 46: T1 · axial · 3.0mm · 1.15mm/px · 1 of 28 slices shown (36 of 48)]
[im 1/28]
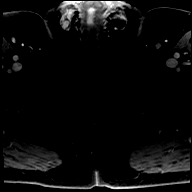

[Series 47: T1 · axial · 3.0mm · 1.15mm/px · 1 of 28 slices shown (37 of 48)]
[im 1/28]
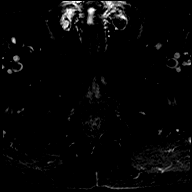

[Series 48: T1 · axial · 3.0mm · 1.15mm/px · 1 of 28 slices shown (38 of 48)]
[im 1/28]
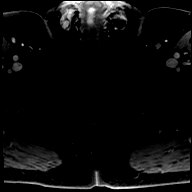

[Series 49: T1 · axial · 3.0mm · 1.15mm/px · 1 of 28 slices shown (39 of 48)]
[im 1/28]
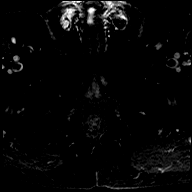

[Series 50: T1 · axial · 3.0mm · 1.15mm/px · 1 of 28 slices shown (40 of 48)]
[im 1/28]
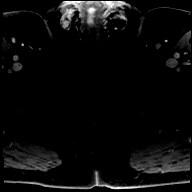

[Series 51: T1 · axial · 3.0mm · 1.15mm/px · 1 of 28 slices shown (41 of 48)]
[im 1/28]
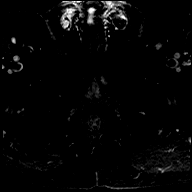

[Series 52: T1 · axial · 3.0mm · 1.15mm/px · 1 of 28 slices shown (42 of 48)]
[im 1/28]
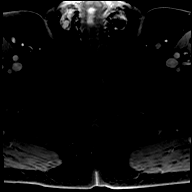

[Series 53: T1 · axial · 3.0mm · 1.15mm/px · 1 of 28 slices shown (43 of 48)]
[im 1/28]
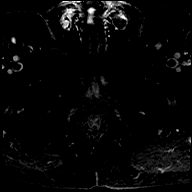

[Series 54: T1 · axial · 3.0mm · 1.15mm/px · 1 of 28 slices shown (44 of 48)]
[im 1/28]
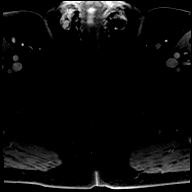

[Series 55: T1 · axial · 3.0mm · 1.15mm/px · 1 of 28 slices shown (45 of 48)]
[im 1/28]
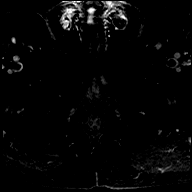

[Series 56: T1 · axial · 3.0mm · 1.15mm/px · 1 of 28 slices shown (46 of 48)]
[im 1/28]
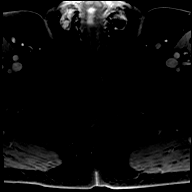

[Series 57: T1 · axial · 3.0mm · 1.15mm/px · 1 of 28 slices shown (47 of 48)]
[im 1/28]
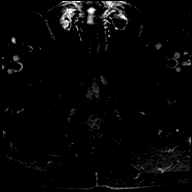

[Series 58: T1 · axial · 3.0mm · 1.15mm/px · 1 of 28 slices shown (48 of 48)]
[im 1/28]
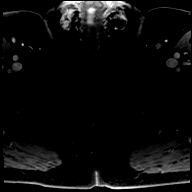

[56 of 56 positions shown; findings below may reference images not displayed]

FINDINGS: Prostate:

-- Peripheral Zone: Linear/wedge shaped hypointensity is seen on
ADC, which appears stable. No focal nodules with ADC hypointensity
or high b-value DWI hyperintensity identified.

-- Transition/Central Zone: Stable circumscribed BPH nodules noted,
but no suspicious nodules with obscured or non-circumscribed margins
seen.

-- Volume:  4.8 x 3.6 x 4.9 cm (volume = 44 cm^3)

Transcapsular spread:  Absent

Seminal vesicle involvement:  Absent

Neurovascular bundle involvement:  Absent

Pelvic adenopathy: None visualized

Bone metastasis: None visualized

Other: Mild sigmoid colon diverticulosis, without evidence of acute
diverticulitis.
IMPRESSION: No radiographic evidence of high-grade prostate carcinoma. PI-RADS
2: Low (clinically significant cancer is unlikely to be present)

## 2020-11-04 ENCOUNTER — Other Ambulatory Visit: Payer: Self-pay | Admitting: Family Medicine

## 2020-11-04 NOTE — Telephone Encounter (Signed)
Appointment 01/31/21-Rx RF

## 2020-11-18 ENCOUNTER — Other Ambulatory Visit: Payer: Self-pay | Admitting: Family Medicine

## 2020-11-18 DIAGNOSIS — E119 Type 2 diabetes mellitus without complications: Secondary | ICD-10-CM

## 2020-11-18 DIAGNOSIS — I10 Essential (primary) hypertension: Secondary | ICD-10-CM

## 2020-12-14 ENCOUNTER — Other Ambulatory Visit: Payer: Self-pay | Admitting: Family Medicine

## 2020-12-14 NOTE — Telephone Encounter (Signed)
Requested Prescriptions  Pending Prescriptions Disp Refills  . metFORMIN (GLUCOPHAGE) 1000 MG tablet [Pharmacy Med Name: METFORMIN 1000MG TABLETS] 180 tablet 0    Sig: TAKE 1 TABLET(1000 MG) BY MOUTH TWICE DAILY     Endocrinology:  Diabetes - Biguanides Failed - 12/14/2020  3:13 AM      Failed - Cr in normal range and within 360 days    Creatinine, Ser  Date Value Ref Range Status  04/05/2020 1.34 (H) 0.76 - 1.27 mg/dL Final         Failed - eGFR in normal range and within 360 days    GFR calc Af Amer  Date Value Ref Range Status  04/05/2020 60 >59 mL/min/1.73 Final    Comment:    **[facility] currently reports eGFR in compliance with the current**   recommendations of the [employer]. [facility] will   update reporting as new guidelines are published from the NKF-ASN   Task force.    GFR calc non Af Amer  Date Value Ref Range Status  04/05/2020 52 (L) >59 mL/min/1.73 Final         Passed - HBA1C is between 0 and 7.9 and within 180 days    Hemoglobin A1C  Date Value Ref Range Status  10/03/2020 6.5 (A) 4.0 - 5.6 % Final   Hgb A1c MFr Bld  Date Value Ref Range Status  04/05/2020 6.6 (H) 4.8 - 5.6 % Final    Comment:             Prediabetes: 5.7 - 6.4          Diabetes: >6.4          Glycemic control for adults with diabetes: <7.0          Passed - Valid encounter within last 6 months    Recent Outpatient Visits          2 months ago Type 2 diabetes mellitus without complication, without long-term current use of insulin [facility])   [facility] Jerrol Banana., MD   8 months ago Annual physical exam   [facility] Jerrol Banana., MD   1 year ago Type 2 diabetes mellitus without complication, without long-term current use of insulin [facility])   [facility] Jerrol Banana., MD   1 year ago Annual physical exam   [facility] Jerrol Banana., MD   2 years ago Type 2 diabetes mellitus  without complication, without long-term current use of insulin [facility])   [facility] Jerrol Banana., MD      Future Appointments            In 1 month Jerrol Banana., MD [facility] [place]   In 2 months Hollice Espy, MD [facility]

## 2021-01-30 ENCOUNTER — Other Ambulatory Visit: Payer: Self-pay | Admitting: Family Medicine

## 2021-01-30 NOTE — Telephone Encounter (Signed)
Requested Prescriptions  Pending Prescriptions Disp Refills  . benazepril (LOTENSIN) 40 MG tablet [Pharmacy Med Name: BENAZEPRIL '40MG'$  TABLETS] 90 tablet 0    Sig: TAKE 1 TABLET BY MOUTH EVERY DAY     Cardiovascular:  ACE Inhibitors Failed - 01/30/2021  3:11 AM      Failed - Cr in normal range and within 180 days    Creatinine, Ser  Date Value Ref Range Status  04/05/2020 1.34 (H) 0.76 - 1.27 mg/dL Final         Failed - K in normal range and within 180 days    Potassium  Date Value Ref Range Status  04/05/2020 5.2 3.5 - 5.2 mmol/L Final         Failed - Last BP in normal range    BP Readings from Last 1 Encounters:  10/03/20 (!) 164/70         Passed - Patient is not pregnant      Passed - Valid encounter within last 6 months    Recent Outpatient Visits          3 months ago Type 2 diabetes mellitus without complication, without long-term current use of insulin ([place])   [facility] Jerrol Banana., MD   10 months ago Annual physical exam   [facility] Jerrol Banana., MD   1 year ago Type 2 diabetes mellitus without complication, without long-term current use of insulin [facility])   [facility] Jerrol Banana., MD   1 year ago Annual physical exam   [facility] Jerrol Banana., MD   2 years ago Type 2 diabetes mellitus without complication, without long-term current use of insulin [facility])   [facility] Jerrol Banana., MD      Future Appointments            Tomorrow Jerrol Banana., MD [facility] [place]   In 1 month Hollice Espy, MD [facility]

## 2021-01-31 ENCOUNTER — Encounter: Payer: Self-pay | Admitting: Family Medicine

## 2021-01-31 ENCOUNTER — Other Ambulatory Visit: Payer: Self-pay

## 2021-01-31 ENCOUNTER — Ambulatory Visit (INDEPENDENT_AMBULATORY_CARE_PROVIDER_SITE_OTHER): Payer: PPO | Admitting: Family Medicine

## 2021-01-31 VITALS — BP 141/62 | HR 72 | Temp 98.6°F | Resp 16 | Ht 71.0 in | Wt 194.0 lb

## 2021-01-31 DIAGNOSIS — E7800 Pure hypercholesterolemia, unspecified: Secondary | ICD-10-CM | POA: Diagnosis not present

## 2021-01-31 DIAGNOSIS — I10 Essential (primary) hypertension: Secondary | ICD-10-CM

## 2021-01-31 DIAGNOSIS — Z8546 Personal history of malignant neoplasm of prostate: Secondary | ICD-10-CM

## 2021-01-31 DIAGNOSIS — E119 Type 2 diabetes mellitus without complications: Secondary | ICD-10-CM

## 2021-01-31 DIAGNOSIS — L570 Actinic keratosis: Secondary | ICD-10-CM | POA: Diagnosis not present

## 2021-01-31 MED ORDER — HYDROCHLOROTHIAZIDE 25 MG PO TABS: 25.0000 mg | ORAL_TABLET | Freq: Every day | ORAL | 3 refills | Status: DC

## 2021-01-31 NOTE — Progress Notes (Signed)
Established patient visit   Patient: Juan Mack   DOB: 03/18/1946   75 y.o. Male  MRN: [id number] Visit Date: 01/31/2021  Today's healthcare provider: Wilhemena Durie, MD   Chief Complaint  Patient presents with   Diabetes   Hypertension   Hyperlipidemia   Subjective    HPI Diabetes Mellitus Type II, follow-up  Lab Results  Component Value Date   HGBA1C 6.5 (A) 10/03/2020   HGBA1C 6.6 (H) 04/05/2020   HGBA1C 6.6 (A) 09/29/2019   Last seen for diabetes 4 months ago.  Management since then includes continuing the same treatment. He reports good compliance with treatment. He is not having side effects.   Home blood sugar records: not being checked.  Episodes of hypoglycemia? No    Current insulin regiment: none Most Recent Eye Exam: 11/03/2019  Hypertension, follow-up  BP Readings from Last 3 Encounters:  01/31/21 (!) 141/62  10/03/20 (!) 164/70  09/12/20 (!) 161/67   Wt Readings from Last 3 Encounters:  01/31/21 194 lb (88 kg)  10/03/20 195 lb (88.5 kg)  09/12/20 185 lb (83.9 kg)     He was last seen for hypertension 4 months ago.  BP at that visit was 164/70. Management since that visit includes; Good control.  Check blood pressures at home.  Bring in readings on next visit. He reports good compliance with treatment. He is not having side effects.  He is exercising. He is not adherent to low salt diet.   Outside blood pressures are checked occasionally .  He does not smoke.  Use of agents associated with hypertension: none.   Lipid/Cholesterol, follow-up  Last Lipid Panel: Lab Results  Component Value Date   CHOL 106 04/05/2020   LDLCALC 50 04/05/2020   HDL 36 (L) 04/05/2020   TRIG 105 04/05/2020    He was last seen for this 10 months ago.  Management since that visit includes; on atorvastatin.  He reports excellent compliance with treatment. He is not having side effects.   He is following a Regular diet. Current exercise:  no regular exercise  Last metabolic panel Lab Results  Component Value Date   GLUCOSE 139 (H) 04/05/2020   NA 136 04/05/2020   K 5.2 04/05/2020   BUN 17 04/05/2020   CREATININE 1.34 (H) 04/05/2020   GFRNONAA 52 (L) 04/05/2020   GFRAA 60 04/05/2020   CALCIUM 9.7 04/05/2020   AST 10 04/05/2020   ALT 9 04/05/2020   The ASCVD Risk score (Goff DC Jr., et al., 2013) failed to calculate for the following reasons:   The valid total cholesterol range is 130 to 320 mg/dL      Medications: Outpatient Medications Prior to Visit  Medication Sig   amLODipine (NORVASC) 10 MG tablet TAKE 1 TABLET(10 MG) BY MOUTH DAILY   aspirin 81 MG EC tablet Take 81 mg by mouth daily. Swallow whole.   atorvastatin (LIPITOR) 20 MG tablet TAKE 1 TABLET(20 MG) BY MOUTH DAILY   benazepril (LOTENSIN) 40 MG tablet TAKE 1 TABLET BY MOUTH EVERY DAY   blood glucose meter kit and supplies Dispense based on patient and insurance preference. Use once time daily as directed. (FOR ICD-E11.9).   glucose blood test strip Use as instructed   latanoprost (XALATAN) 0.005 % ophthalmic solution Place 1 drop into both eyes at bedtime.   metFORMIN (GLUCOPHAGE) 1000 MG tablet TAKE 1 TABLET(1000 MG) BY MOUTH TWICE DAILY   pioglitazone (ACTOS) 45 MG tablet TAKE 1 TABLET(45  MG) BY MOUTH DAILY   No facility-administered medications prior to visit.    Review of Systems  Constitutional:  Negative for appetite change, chills and fever.  Respiratory:  Negative for chest tightness, shortness of breath and wheezing.   Cardiovascular:  Negative for chest pain and palpitations.  Gastrointestinal:  Negative for abdominal pain, nausea and vomiting.       Objective    BP (!) 141/62   Pulse 72   Temp 98.6 F (37 C)   Resp 16   Ht $R'5\' 11"'wl$  (1.803 m)   Wt 194 lb (88 kg)   BMI 27.06 kg/m  BP Readings from Last 3 Encounters:  01/31/21 (!) 141/62  10/03/20 (!) 164/70  09/12/20 (!) 161/67   Wt Readings from Last 3 Encounters:   01/31/21 194 lb (88 kg)  10/03/20 195 lb (88.5 kg)  09/12/20 185 lb (83.9 kg)       Physical Exam Vitals reviewed.  Constitutional:      Appearance: Normal appearance. He is well-developed.  HENT:     Head: Normocephalic and atraumatic.     Right Ear: External ear normal.     Left Ear: External ear normal.     Nose: Nose normal.  Eyes:     Conjunctiva/sclera: Conjunctivae normal.     Pupils: Pupils are equal, round, and reactive to light.  Cardiovascular:     Rate and Rhythm: Normal rate and regular rhythm.     Heart sounds: Normal heart sounds.  Pulmonary:     Effort: Pulmonary effort is normal.     Breath sounds: Normal breath sounds.  Abdominal:     General: Bowel sounds are normal.     Palpations: Abdomen is soft.  Musculoskeletal:     Cervical back: Normal range of motion and neck supple.  Skin:    General: Skin is warm and dry.     Comments: Some AK's noted.  Neurological:     General: No focal deficit present.     Mental Status: He is alert and oriented to person, place, and time.     Comments: Normal diabetic monofilament exam.  Psychiatric:        Mood and Affect: Mood normal.        Behavior: Behavior normal.        Thought Content: Thought content normal.        Judgment: Judgment normal.      No results found for any visits on 01/31/21.  Assessment & Plan     1. Type 2 diabetes mellitus without complication, without long-term current use of insulin (HCC) Goal A1c less than 7 on metformin and Actos - Hemoglobin A1c  2. Essential (primary) hypertension Control on Lotensin and amlodipine .  Home readings 140s to 150s so we will add HCTZ every morning - CBC w/Diff/Platelet - Comprehensive Metabolic Panel (CMET) - hydrochlorothiazide (HYDRODIURIL) 25 MG tablet; Take 1 tablet (25 mg total) by mouth daily.  Dispense: 90 tablet; Refill: 3  3. Pure hypercholesterolemia On atorvastatin 20 - Lipid panel - TSH  4. AK (actinic keratosis)  - Ambulatory  referral to Dermatology  5. History of prostate cancer    No follow-ups on file.      I, Wilhemena Durie, MD, have reviewed all documentation for this visit. The documentation on 02/16/21 for the exam, diagnosis, procedures, and orders are all accurate and complete.    Brooks Kinnan Cranford Mon, MD  [facility] [phone] (phone) [phone] (fax)  [place]  [facility]

## 2021-02-01 LAB — CBC WITH DIFFERENTIAL/PLATELET
Basophils Absolute: 0.1 10*3/uL (ref 0.0–0.2)
Basos: 1 %
EOS (ABSOLUTE): 0.3 10*3/uL (ref 0.0–0.4)
Eos: 3 %
Hematocrit: 36 % — ABNORMAL LOW (ref 37.5–51.0)
Hemoglobin: 11.9 g/dL — ABNORMAL LOW (ref 13.0–17.7)
Immature Grans (Abs): 0 10*3/uL (ref 0.0–0.1)
Immature Granulocytes: 0 %
Lymphocytes Absolute: 2.4 10*3/uL (ref 0.7–3.1)
Lymphs: 26 %
MCH: 31.7 pg (ref 26.6–33.0)
MCHC: 33.1 g/dL (ref 31.5–35.7)
MCV: 96 fL (ref 79–97)
Monocytes Absolute: 0.9 10*3/uL (ref 0.1–0.9)
Monocytes: 9 %
Neutrophils Absolute: 5.8 10*3/uL (ref 1.4–7.0)
Neutrophils: 61 %
Platelets: 267 10*3/uL (ref 150–450)
RBC: 3.75 x10E6/uL — ABNORMAL LOW (ref 4.14–5.80)
RDW: 13.4 % (ref 11.6–15.4)
WBC: 9.5 10*3/uL (ref 3.4–10.8)

## 2021-02-01 LAB — COMPREHENSIVE METABOLIC PANEL
ALT: 7 IU/L (ref 0–44)
AST: 8 IU/L (ref 0–40)
Albumin/Globulin Ratio: 1.4 (ref 1.2–2.2)
Albumin: 4.3 g/dL (ref 3.7–4.7)
Alkaline Phosphatase: 64 IU/L (ref 44–121)
BUN/Creatinine Ratio: 16 (ref 10–24)
BUN: 20 mg/dL (ref 8–27)
Bilirubin Total: 0.7 mg/dL (ref 0.0–1.2)
CO2: 20 mmol/L (ref 20–29)
Calcium: 9.7 mg/dL (ref 8.6–10.2)
Chloride: 103 mmol/L (ref 96–106)
Creatinine, Ser: 1.26 mg/dL (ref 0.76–1.27)
Globulin, Total: 3.1 g/dL (ref 1.5–4.5)
Glucose: 133 mg/dL — ABNORMAL HIGH (ref 65–99)
Potassium: 4.9 mmol/L (ref 3.5–5.2)
Sodium: 139 mmol/L (ref 134–144)
Total Protein: 7.4 g/dL (ref 6.0–8.5)
eGFR: 59 mL/min/{1.73_m2} — ABNORMAL LOW (ref >59)

## 2021-02-01 LAB — LIPID PANEL
Chol/HDL Ratio: 3 ratio (ref 0.0–5.0)
Cholesterol, Total: 104 mg/dL (ref 100–199)
HDL: 35 mg/dL — ABNORMAL LOW (ref >39)
LDL Chol Calc (NIH): 52 mg/dL (ref 0–99)
Triglycerides: 87 mg/dL (ref 0–149)
VLDL Cholesterol Cal: 17 mg/dL (ref 5–40)

## 2021-02-01 LAB — HEMOGLOBIN A1C
Est. average glucose Bld gHb Est-mCnc: 137 mg/dL
Hgb A1c MFr Bld: 6.4 % — ABNORMAL HIGH (ref 4.8–5.6)

## 2021-02-01 LAB — TSH: TSH: 2.14 u[IU]/mL (ref 0.450–4.500)

## 2021-02-09 DIAGNOSIS — H2511 Age-related nuclear cataract, right eye: Secondary | ICD-10-CM | POA: Diagnosis not present

## 2021-03-09 ENCOUNTER — Other Ambulatory Visit: Payer: PPO

## 2021-03-09 ENCOUNTER — Other Ambulatory Visit: Payer: Self-pay

## 2021-03-09 DIAGNOSIS — C61 Malignant neoplasm of prostate: Secondary | ICD-10-CM

## 2021-03-10 LAB — PSA: Prostate Specific Ag, Serum: 2.9 ng/mL (ref 0.0–4.0)

## 2021-03-11 ENCOUNTER — Other Ambulatory Visit: Payer: Self-pay | Admitting: Family Medicine

## 2021-03-11 NOTE — Telephone Encounter (Signed)
Requested Prescriptions  Pending Prescriptions Disp Refills  . metFORMIN (GLUCOPHAGE) 1000 MG tablet [Pharmacy Med Name: METFORMIN 1000MG TABLETS] 180 tablet 0    Sig: TAKE 1 TABLET(1000 MG) BY MOUTH TWICE DAILY     Endocrinology:  Diabetes - Biguanides Failed - 03/11/2021  3:11 AM      Failed - eGFR in normal range and within 360 days    GFR calc Af Amer  Date Value Ref Range Status  04/05/2020 60 >59 mL/min/1.73 Final    Comment:    **[facility] currently reports eGFR in compliance with the current**   recommendations of the [employer]. [facility] will   update reporting as new guidelines are published from the NKF-ASN   Task force.    GFR calc non Af Amer  Date Value Ref Range Status  04/05/2020 52 (L) >59 mL/min/1.73 Final   eGFR  Date Value Ref Range Status  01/31/2021 59 (L) >59 mL/min/1.73 Final         Passed - Cr in normal range and within 360 days    Creatinine, Ser  Date Value Ref Range Status  01/31/2021 1.26 0.76 - 1.27 mg/dL Final         Passed - HBA1C is between 0 and 7.9 and within 180 days    Hgb A1c MFr Bld  Date Value Ref Range Status  01/31/2021 6.4 (H) 4.8 - 5.6 % Final    Comment:             Prediabetes: 5.7 - 6.4          Diabetes: >6.4          Glycemic control for adults with diabetes: <7.0          Passed - Valid encounter within last 6 months    Recent Outpatient Visits          1 month ago Type 2 diabetes mellitus without complication, without long-term current use of insulin [facility])   [facility] Jerrol Banana., MD   5 months ago Type 2 diabetes mellitus without complication, without long-term current use of insulin [facility])   [facility] Jerrol Banana., MD   11 months ago Annual physical exam   [facility] Jerrol Banana., MD   1 year ago Type 2 diabetes mellitus without complication, without long-term current use of insulin [facility])   [facility]  Jerrol Banana., MD   1 year ago Annual physical exam   [facility] Jerrol Banana., MD      Future Appointments            In 2 days Hollice Espy, MD [place]   In 2 months Jerrol Banana., MD [facility] [place]

## 2021-03-12 NOTE — Progress Notes (Signed)
 03/13/21 2:35 PM   Juan Mack 10/04/1945 [id number]  Referring provider:  Gilbert, Richard L Jr., MD [address] [place],  [place] [address] Chief Complaint  Patient presents with   Prostate Cancer    6 follow up     HPI: Juan Mack is a 74 y.o.male with a personal history of prostate cancer who returns today for routine surveillance.   He was diagnosed in 11/2014 with low risk prostate cancer. Gleason score 3+3 in the right mid lateral 1/2 biopsies 3% with 4.0 PSA.    Endorectal MRI in 01/2016 was unremarkable.   Currently on active surveillance. PSA remained stable around 2-2.5 ng/dL. PSA slightly elevated at 3.0 on 08/25/2018. Most recent PSA decreased to 2.5 on 08/27/2019. Most recent PSA on 03/09/2021 was 2.9.   He has known firm induration on the left which has been unchanged. His last rectal exam was in March.   He is doing well today. He has been having a weak stream that has no been bothersome. He feels that he is emptying adequately.   PSA trend Component Prostate Specific Ag, Serum  Latest Ref Rng & Units 0.0 - 4.0 ng/mL  06/25/2016 2.0  12/24/2016 2.2  07/17/2017 2.8  01/19/2018 2.4  08/25/2018 3.0  08/27/2019 2.5  03/14/2020 3.0  09/11/2020 2.7  03/09/2021 2.9     PMH: Past Medical History:  Diagnosis Date   Anxiety    Anxiety disorder    BCC (basal cell carcinoma)    Central vein occlusion of retina    Dermatitis    Diabetes mellitus, type II (HCC)    Elevated PSA    Hyperlipemia    Hyperlipidemia    Hypertension    Insomnia    Insomnia    OSA on CPAP    not using CPAP   Prostate nodule    Pruritus    Stable central retinal vein occlusion     Surgical History: Past Surgical History:  Procedure Laterality Date   CATARACT EXTRACTION W/PHACO Left 12/27/2019   Procedure: CATARACT EXTRACTION PHACO AND INTRAOCULAR LENS PLACEMENT (IOC) LEFT DIABETIC 2.58  00:23.8;  Surgeon: King, Bradley Mark, MD;  Location: [facility];   Service: Ophthalmology;  Laterality: Left;   COLONOSCOPY WITH PROPOFOL N/A 03/21/2016   Procedure: COLONOSCOPY WITH PROPOFOL;  Surgeon: Robert T Elliott, MD;  Location: [facility];  Service: Endoscopy;  Laterality: N/A;   COLONOSCOPY WITH PROPOFOL N/A 11/13/2016   Procedure: COLONOSCOPY WITH PROPOFOL;  Surgeon: Elliott, Robert T, MD;  Location: [facility];  Service: Endoscopy;  Laterality: N/A;   NO PAST SURGERIES     TOOTH EXTRACTION      Home Medications:  Allergies as of 03/13/2021   No Known Allergies      Medication List        Accurate as of March 13, 2021  2:35 PM. If you have any questions, ask your nurse or doctor.          amLODipine 10 MG tablet Commonly known as: NORVASC TAKE 1 TABLET(10 MG) BY MOUTH DAILY   aspirin 81 MG EC tablet Take 81 mg by mouth daily. Swallow whole.   atorvastatin 20 MG tablet Commonly known as: LIPITOR TAKE 1 TABLET(20 MG) BY MOUTH DAILY   benazepril 40 MG tablet Commonly known as: LOTENSIN TAKE 1 TABLET BY MOUTH EVERY DAY   blood glucose meter kit and supplies Dispense based on patient and insurance preference. Use once time daily as directed. (FOR ICD-E11.9).     glucose blood test strip Use as instructed   hydrochlorothiazide 25 MG tablet Commonly known as: HYDRODIURIL Take 1 tablet (25 mg total) by mouth daily.   latanoprost 0.005 % ophthalmic solution Commonly known as: XALATAN Place 1 drop into both eyes at bedtime.   metFORMIN 1000 MG tablet Commonly known as: GLUCOPHAGE TAKE 1 TABLET(1000 MG) BY MOUTH TWICE DAILY   pioglitazone 45 MG tablet Commonly known as: ACTOS TAKE 1 TABLET(45 MG) BY MOUTH DAILY        Allergies: No Known Allergies  Family History: Family History  Problem Relation Age of Onset   Prostate cancer Brother        Father   Diabetes Brother    Colon cancer Brother    Pancreatic cancer Sister    Lung cancer Sister    Coronary artery disease Father    Heart disease Father     Congestive Heart Failure Mother    Diabetes Mother     Social History:  reports that he quit smoking about 47 years ago. His smoking use included cigarettes. He has never used smokeless tobacco. He reports that he does not drink alcohol and does not use drugs.   Physical Exam: BP (!) 160/67   Pulse 80   Ht 5' 11" (1.803 m)   Wt 187 lb (84.8 kg)   BMI 26.08 kg/m   Constitutional:  Alert and oriented, No acute distress. HEENT: [place] AT, moist mucus membranes.  Trachea midline, no masses. Cardiovascular: No clubbing, cyanosis, or edema. Respiratory: Normal respiratory effort, no increased work of breathing. Skin: No rashes, bruises or suspicious lesions. Neurologic: Grossly intact, no focal deficits, moving all 4 extremities. Psychiatric: Normal mood and affect.  Laboratory Data:  Lab Results  Component Value Date   CREATININE 1.26 01/31/2021     Lab Results  Component Value Date   HGBA1C 6.4 (H) 01/31/2021     Pertinent Imaging: Results for orders placed or performed in visit on 03/13/21  BLADDER SCAN AMB NON-IMAGING  Result Value Ref Range   Scan Result 45ml      Assessment & Plan:    Prostate cancer (HCC)  - PSA is stable  - Personal history of low risk prostate cancer on active surveillance  - will continue o monitor PSA   2. Abnormal digital rectal exam  - As above   Return in about 1 year (around 03/13/2022) for 6mo PSA only, 1year w/PSA prior.  I,Kailey Littlejohn,acting as a scribe for Ashley Brandon, MD.,have documented all relevant documentation on the behalf of Ashley Brandon, MD,as directed by  Ashley Brandon, MD while in the presence of Ashley Brandon, MD.  I have reviewed the above documentation for accuracy and completeness, and I agree with the above.   Ashley Brandon, MD   [facility] [address], [address] [place], [place] [address] [phone]  

## 2021-03-13 ENCOUNTER — Other Ambulatory Visit: Payer: Self-pay

## 2021-03-13 ENCOUNTER — Other Ambulatory Visit: Payer: Self-pay | Admitting: Family Medicine

## 2021-03-13 ENCOUNTER — Ambulatory Visit: Payer: PPO | Admitting: Urology

## 2021-03-13 VITALS — BP 160/67 | HR 80 | Ht 71.0 in | Wt 187.0 lb

## 2021-03-13 DIAGNOSIS — C61 Malignant neoplasm of prostate: Secondary | ICD-10-CM

## 2021-03-13 LAB — BLADDER SCAN AMB NON-IMAGING

## 2021-04-27 ENCOUNTER — Other Ambulatory Visit: Payer: Self-pay | Admitting: Family Medicine

## 2021-05-09 ENCOUNTER — Ambulatory Visit (INDEPENDENT_AMBULATORY_CARE_PROVIDER_SITE_OTHER): Payer: PPO

## 2021-05-09 ENCOUNTER — Other Ambulatory Visit: Payer: Self-pay

## 2021-05-09 DIAGNOSIS — Z23 Encounter for immunization: Secondary | ICD-10-CM

## 2021-05-12 ENCOUNTER — Other Ambulatory Visit: Payer: Self-pay | Admitting: Family Medicine

## 2021-05-12 DIAGNOSIS — E119 Type 2 diabetes mellitus without complications: Secondary | ICD-10-CM

## 2021-05-12 DIAGNOSIS — I10 Essential (primary) hypertension: Secondary | ICD-10-CM

## 2021-05-12 NOTE — Telephone Encounter (Signed)
Requested Prescriptions  Pending Prescriptions Disp Refills  . amLODipine (NORVASC) 10 MG tablet [Pharmacy Med Name: AMLODIPINE BESYLATE 10MG  TABLETS] 90 tablet 0    Sig: TAKE 1 TABLET(10 MG) BY MOUTH DAILY     Cardiovascular:  Calcium Channel Blockers Failed - 05/12/2021  3:13 AM      Failed - Last BP in normal range    BP Readings from Last 1 Encounters:  03/13/21 (!) 160/67         Passed - Valid encounter within last 6 months    Recent Outpatient Visits          3 months ago Type 2 diabetes mellitus without complication, without long-term current use of insulin [facility])   [facility] Jerrol Banana., MD   7 months ago Type 2 diabetes mellitus without complication, without long-term current use of insulin [facility])   [facility] Jerrol Banana., MD   1 year ago Annual physical exam   [facility] Jerrol Banana., MD   1 year ago Type 2 diabetes mellitus without complication, without long-term current use of insulin [facility])   [facility] Jerrol Banana., MD   2 years ago Annual physical exam   [facility] Jerrol Banana., MD      Future Appointments            In 3 weeks Jerrol Banana., MD [facility] [place]           . pioglitazone (ACTOS) 45 MG tablet [Pharmacy Med Name: PIOGLITAZONE 45MG  TABLETS] 90 tablet 0    Sig: TAKE 1 TABLET(45 MG) BY MOUTH DAILY     Endocrinology:  Diabetes - Glitazones - pioglitazone Passed - 05/12/2021  3:13 AM      Passed - HBA1C is between 0 and 7.9 and within 180 days    Hgb A1c MFr Bld  Date Value Ref Range Status  01/31/2021 6.4 (H) 4.8 - 5.6 % Final    Comment:             Prediabetes: 5.7 - 6.4          Diabetes: >6.4          Glycemic control for adults with diabetes: <7.0          Passed - Valid encounter within last 6 months    Recent Outpatient Visits          3 months ago Type 2 diabetes mellitus without  complication, without long-term current use of insulin [facility])   [facility] Jerrol Banana., MD   7 months ago Type 2 diabetes mellitus without complication, without long-term current use of insulin [facility])   [facility] Jerrol Banana., MD   1 year ago Annual physical exam   [facility] Jerrol Banana., MD   1 year ago Type 2 diabetes mellitus without complication, without long-term current use of insulin [facility])   [facility] Jerrol Banana., MD   2 years ago Annual physical exam   [facility] Jerrol Banana., MD      Future Appointments            In 3 weeks Jerrol Banana., MD [facility], [place]

## 2021-06-05 ENCOUNTER — Encounter: Payer: Self-pay | Admitting: Family Medicine

## 2021-06-05 ENCOUNTER — Ambulatory Visit (INDEPENDENT_AMBULATORY_CARE_PROVIDER_SITE_OTHER): Payer: PPO | Admitting: Family Medicine

## 2021-06-05 ENCOUNTER — Other Ambulatory Visit: Payer: Self-pay

## 2021-06-05 VITALS — BP 159/68 | HR 65 | Temp 98.1°F | Wt 191.6 lb

## 2021-06-05 DIAGNOSIS — U071 COVID-19: Secondary | ICD-10-CM

## 2021-06-05 DIAGNOSIS — E7800 Pure hypercholesterolemia, unspecified: Secondary | ICD-10-CM | POA: Diagnosis not present

## 2021-06-05 DIAGNOSIS — E119 Type 2 diabetes mellitus without complications: Secondary | ICD-10-CM | POA: Diagnosis not present

## 2021-06-05 DIAGNOSIS — G4733 Obstructive sleep apnea (adult) (pediatric): Secondary | ICD-10-CM | POA: Diagnosis not present

## 2021-06-05 DIAGNOSIS — I10 Essential (primary) hypertension: Secondary | ICD-10-CM | POA: Diagnosis not present

## 2021-06-05 LAB — POCT GLYCOSYLATED HEMOGLOBIN (HGB A1C)
Est. average glucose Bld gHb Est-mCnc: 137
Hemoglobin A1C: 6.4 % — AB (ref 4.0–5.6)

## 2021-06-05 NOTE — Progress Notes (Signed)
I,April Miller,acting as a scribe for Wilhemena Durie, MD.,have documented all relevant documentation on the behalf of Wilhemena Durie, MD,as directed by  Wilhemena Durie, MD while in the presence of Wilhemena Durie, MD.   Established patient visit   Patient: Juan Mack   DOB: [birth year]   75 y.o. Male  MRN: [id number] Visit Date: 06/05/2021  Today's healthcare provider: Wilhemena Durie, MD   Chief Complaint  Patient presents with   Follow-up   Diabetes   Hypertension    Subjective    HPI  Patient comes in today for follow-up.  Overall he is feeling well. He has had COVID twice and is lost his sense of taste to anything normal.  He is wondering what he can do to help this return.  I told him I actually have no idea. He is retired from [employer] and is married and has 4 grandchildren and 2 great-grandchildren Diabetes Mellitus Type II, follow-up  Lab Results  Component Value Date   HGBA1C 6.4 (A) 06/05/2021   HGBA1C 6.4 (H) 01/31/2021   HGBA1C 6.5 (A) 10/03/2020   Last seen for diabetes 4 months ago.  Management since then includes; Goal A1c less than 7 on metformin and Actos. He reports good compliance with treatment. He is not having side effects. none  Home blood sugar records: fasting range: 140  Episodes of hypoglycemia? No none   Current insulin regiment: n/a Most Recent Eye Exam: due  --------------------------------------------------------------------------------------------------- Hypertension, follow-up  BP Readings from Last 3 Encounters:  06/05/21 (!) 159/68  03/13/21 (!) 160/67  01/31/21 (!) 141/62   Wt Readings from Last 3 Encounters:  06/05/21 191 lb 9.6 oz (86.9 kg)  03/13/21 187 lb (84.8 kg)  01/31/21 194 lb (88 kg)     He was last seen for hypertension 4 months ago.  BP at that visit was 141/62. Management since that visit includes; Control on Lotensin and amlodipine .  Home readings 140s to 150s so we will add HCTZ  every morning He reports good compliance with treatment. He is not having side effects. none He is not exercising. He is adherent to low salt diet.   Outside blood pressures are 145/62.  He does not smoke.  Use of agents associated with hypertension: none.   ---------------------------------------------------------------------------------------------------     Medications: Outpatient Medications Prior to Visit  Medication Sig   amLODipine (NORVASC) 10 MG tablet TAKE 1 TABLET(10 MG) BY MOUTH DAILY   aspirin 81 MG EC tablet Take 81 mg by mouth daily. Swallow whole.   atorvastatin (LIPITOR) 20 MG tablet TAKE 1 TABLET(20 MG) BY MOUTH DAILY   benazepril (LOTENSIN) 40 MG tablet TAKE 1 TABLET BY MOUTH EVERY DAY   blood glucose meter kit and supplies Dispense based on patient and insurance preference. Use once time daily as directed. (FOR ICD-E11.9).   glucose blood test strip Use as instructed   hydrochlorothiazide (HYDRODIURIL) 25 MG tablet Take 1 tablet (25 mg total) by mouth daily.   latanoprost (XALATAN) 0.005 % ophthalmic solution Place 1 drop into both eyes at bedtime.   metFORMIN (GLUCOPHAGE) 1000 MG tablet TAKE 1 TABLET(1000 MG) BY MOUTH TWICE DAILY   pioglitazone (ACTOS) 45 MG tablet TAKE 1 TABLET(45 MG) BY MOUTH DAILY   No facility-administered medications prior to visit.    Review of Systems      Objective    BP (!) 159/68 (BP Location: Right Arm, Patient Position: Sitting, Cuff Size: Normal)  Pulse 65   Temp 98.1 F (36.7 C)   Wt 191 lb 9.6 oz (86.9 kg)   SpO2 99%   BMI 26.72 kg/m  BP Readings from Last 3 Encounters:  06/05/21 (!) 159/68  03/13/21 (!) 160/67  01/31/21 (!) 141/62   Wt Readings from Last 3 Encounters:  06/05/21 191 lb 9.6 oz (86.9 kg)  03/13/21 187 lb (84.8 kg)  01/31/21 194 lb (88 kg)      Physical Exam Vitals reviewed.  Constitutional:      Appearance: Normal appearance. He is well-developed.  HENT:     Head: Normocephalic and  atraumatic.     Right Ear: External ear normal.     Left Ear: External ear normal.     Nose: Nose normal.  Eyes:     Conjunctiva/sclera: Conjunctivae normal.     Pupils: Pupils are equal, round, and reactive to light.  Cardiovascular:     Rate and Rhythm: Normal rate and regular rhythm.     Heart sounds: Normal heart sounds.  Pulmonary:     Effort: Pulmonary effort is normal.     Breath sounds: Normal breath sounds.  Abdominal:     General: Bowel sounds are normal.     Palpations: Abdomen is soft.  Musculoskeletal:     Cervical back: Normal range of motion and neck supple.  Skin:    General: Skin is warm and dry.  Neurological:     General: No focal deficit present.     Mental Status: He is alert and oriented to person, place, and time.  Psychiatric:        Mood and Affect: Mood normal.        Behavior: Behavior normal.        Thought Content: Thought content normal.        Judgment: Judgment normal.      Results for orders placed or performed in visit on 06/05/21  POCT glycosylated hemoglobin (Hb A1C)  Result Value Ref Range   Hemoglobin A1C 6.4 (A) 4.0 - 5.6 %   Est. average glucose Bld gHb Est-mCnc 137     Assessment & Plan     1. Type 2 diabetes mellitus without complication, without long-term current use of insulin (HCC) A1c well controlled at 6.4 and it was 6.4 the last time he was here.  He is doing well on metformin maximum dose and pioglitazone 45 - POCT glycosylated hemoglobin (Hb A1C)  2. Essential (primary) hypertension Well-controlled on amlodipine 10 and Lotensin 40  3. Obstructive apnea   4. Pure hypercholesterolemia Atorvastatin 20  5. COVID Post COVID symptoms of loss of taste persist.  I know of no treatment.    Return in about 4 months (around 10/03/2021).      I, Wilhemena Durie, MD, have reviewed all documentation for this visit. The documentation on 06/06/21 for the exam, diagnosis, procedures, and orders are all accurate and  complete.    Dorwin Fitzhenry Cranford Mon, MD  [employer] [phone] (phone) [phone] (fax)  [place]

## 2021-06-06 ENCOUNTER — Other Ambulatory Visit: Payer: Self-pay | Admitting: *Deleted

## 2021-06-06 DIAGNOSIS — E119 Type 2 diabetes mellitus without complications: Secondary | ICD-10-CM

## 2021-06-06 MED ORDER — BLOOD GLUCOSE METER KIT: PACK | 0 refills | Status: DC

## 2021-06-07 ENCOUNTER — Other Ambulatory Visit: Payer: Self-pay | Admitting: *Deleted

## 2021-06-07 DIAGNOSIS — E119 Type 2 diabetes mellitus without complications: Secondary | ICD-10-CM

## 2021-06-07 MED ORDER — GLUCOSE BLOOD VI STRP: ORAL_STRIP | 12 refills | Status: AC

## 2021-06-07 MED ORDER — LANCETS MISC: 12 refills | Status: AC

## 2021-08-07 ENCOUNTER — Other Ambulatory Visit: Payer: Self-pay | Admitting: Family Medicine

## 2021-08-07 DIAGNOSIS — I10 Essential (primary) hypertension: Secondary | ICD-10-CM

## 2021-08-07 DIAGNOSIS — E119 Type 2 diabetes mellitus without complications: Secondary | ICD-10-CM

## 2021-08-07 NOTE — Telephone Encounter (Signed)
Requested Prescriptions  Pending Prescriptions Disp Refills   amLODipine (NORVASC) 10 MG tablet [Pharmacy Med Name: AMLODIPINE BESYLATE 10MG  TABLETS] 90 tablet 0    Sig: TAKE 1 TABLET(10 MG) BY MOUTH DAILY     Cardiovascular:  Calcium Channel Blockers Failed - 08/07/2021  3:11 AM      Failed - Last BP in normal range    BP Readings from Last 1 Encounters:  06/05/21 (!) 159/68         Passed - Valid encounter within last 6 months    Recent Outpatient Visits          2 months ago Type 2 diabetes mellitus without complication, without long-term current use of insulin ([place])   [facility] Jerrol Banana., MD   6 months ago Type 2 diabetes mellitus without complication, without long-term current use of insulin ([place])   [facility] Jerrol Banana., MD   10 months ago Type 2 diabetes mellitus without complication, without long-term current use of insulin ([place])   [facility] Jerrol Banana., MD   1 year ago Annual physical exam   [facility] Jerrol Banana., MD   1 year ago Type 2 diabetes mellitus without complication, without long-term current use of insulin [facility])   [facility] Jerrol Banana., MD      Future Appointments            In 1 month Jerrol Banana., MD [facility]            pioglitazone (ACTOS) 45 MG tablet [Pharmacy Med Name: PIOGLITAZONE 45MG  TABLETS] 90 tablet 0    Sig: TAKE 1 TABLET(45 MG) BY MOUTH DAILY     Endocrinology:  Diabetes - Glitazones - pioglitazone Passed - 08/07/2021  3:11 AM      Passed - HBA1C is between 0 and 7.9 and within 180 days    Hemoglobin A1C  Date Value Ref Range Status  06/05/2021 6.4 (A) 4.0 - 5.6 % Final   Hgb A1c MFr Bld  Date Value Ref Range Status  01/31/2021 6.4 (H) 4.8 - 5.6 % Final    Comment:             Prediabetes: 5.7 - 6.4          Diabetes: >6.4          Glycemic control for adults  with diabetes: <7.0          Passed - Valid encounter within last 6 months    Recent Outpatient Visits          2 months ago Type 2 diabetes mellitus without complication, without long-term current use of insulin [facility])   [facility] Jerrol Banana., MD   6 months ago Type 2 diabetes mellitus without complication, without long-term current use of insulin [facility])   [facility] Jerrol Banana., MD   10 months ago Type 2 diabetes mellitus without complication, without long-term current use of insulin [facility])   [facility] Jerrol Banana., MD   1 year ago Annual physical exam   [facility] Jerrol Banana., MD   1 year ago Type 2 diabetes mellitus without complication, without long-term current use of insulin [facility])   [facility] Jerrol Banana., MD      Future Appointments  In 1 month Jerrol Banana., MD [facility]

## 2021-08-31 ENCOUNTER — Other Ambulatory Visit: Payer: Self-pay

## 2021-08-31 ENCOUNTER — Encounter: Payer: Self-pay | Admitting: Physician Assistant

## 2021-08-31 ENCOUNTER — Ambulatory Visit (INDEPENDENT_AMBULATORY_CARE_PROVIDER_SITE_OTHER): Payer: PPO | Admitting: Physician Assistant

## 2021-08-31 VITALS — BP 112/62 | HR 81 | Temp 98.8°F | Ht 71.0 in | Wt 173.8 lb

## 2021-08-31 DIAGNOSIS — F411 Generalized anxiety disorder: Secondary | ICD-10-CM | POA: Diagnosis not present

## 2021-08-31 DIAGNOSIS — R0982 Postnasal drip: Secondary | ICD-10-CM

## 2021-08-31 DIAGNOSIS — F99 Mental disorder, not otherwise specified: Secondary | ICD-10-CM | POA: Diagnosis not present

## 2021-08-31 DIAGNOSIS — F5105 Insomnia due to other mental disorder: Secondary | ICD-10-CM | POA: Diagnosis not present

## 2021-08-31 MED ORDER — SERTRALINE HCL 50 MG PO TABS: 50.0000 mg | ORAL_TABLET | Freq: Every day | ORAL | 1 refills | Status: DC

## 2021-08-31 MED ORDER — AZELASTINE HCL 0.1 % NA SOLN: 1.0000 | Freq: Two times a day (BID) | NASAL | 12 refills | Status: DC

## 2021-08-31 NOTE — Assessment & Plan Note (Signed)
Discussed starting Ramelteon at night, may have the best side effect profile for his age. When questioned on history of sleep apnea (currently does not wear mask, lost weight) pt became worried that this mediation would make him die in his sleep.   For now we will hold off, recommended 5 mg of melatonin every night.

## 2021-08-31 NOTE — Assessment & Plan Note (Signed)
GAD w/ obsessive nearly paranoid components. Mental status exam today wnl for age.  Discussed therapy, psychiatry. Pt amenable to therapy would like to hold off psychiatry.  Will start with zoloft 50 mg and monitor closely.  Family is very supportive. F/u 6 weeks

## 2021-08-31 NOTE — Patient Instructions (Addendum)
Melatonin 5 mg at night before bed Zoloft 50 mg in AM with breakfast or coffee Azelastine nasal spray before bed

## 2021-08-31 NOTE — Assessment & Plan Note (Signed)
rx azelastine to try before bed Pt has flonase at home, encouraged this in AM.

## 2021-08-31 NOTE — Progress Notes (Signed)
I,Juan Mack,acting as a Education administrator for [employer], PA-C.,have documented all relevant documentation on the behalf of Juan Kirschner, PA-C,as directed by  Juan Kirschner, PA-C while in the presence of Juan Kirschner, PA-C.  Acute Office Visit  Subjective:    Patient ID: Juan Mack, male    DOB: 1945-12-03, 76 y.o.   MRN: [id number]  Cc. Anxiety, panic  Juan Mack is a 76 y/o male with HTN, h/o sleep apnea, and indolent prostate cancer. He is accompanied today by his daughter and wife.  He states that in November he received a phone call from what sounded like his bank, but turned out to be a spam phone call. He thinks he gave them his DOB and the last four of his social. Since then he has had crippling anxiety and panic over someone stealing his money/identity. Family describes his reaction as more of a paranoia than just anxiety. Speaking with his bank, reassurance that his money will be fine, has not helped with panic/anxiety symptoms. He reports racing, constant thoughts about it, and has not been able to sleep since it happened. Reports sleeping maybe 1-2 hours a night. Sometimes able to fall asleep, but unable to stay asleep. He states he sleeps partially upright in a recliner due to 'sinus drainage'. He also has not had an appetite and eats very sporadically. Denies SI, but is extremely troubled by obsessive negative thoughts.    Past Medical History:  Diagnosis Date   Anxiety    Anxiety disorder    BCC (basal cell carcinoma)    Central vein occlusion of retina    Dermatitis    Diabetes mellitus, type II (HCC)    Elevated PSA    Hyperlipemia    Hyperlipidemia    Hypertension    Insomnia    Insomnia    OSA on CPAP    not using CPAP   Prostate nodule    Pruritus    Stable central retinal vein occlusion     Past Surgical History:  Procedure Laterality Date   CATARACT EXTRACTION W/PHACO Left 12/27/2019   Procedure: CATARACT EXTRACTION PHACO AND INTRAOCULAR LENS PLACEMENT  (IOC) LEFT DIABETIC 2.58  00:23.8;  Surgeon: Eulogio Bear, MD;  Location: [place];  Service: Ophthalmology;  Laterality: Left;   COLONOSCOPY WITH PROPOFOL N/A 03/21/2016   Procedure: COLONOSCOPY WITH PROPOFOL;  Surgeon: Manya Silvas, MD;  Location: [facility] ENDOSCOPY;  Service: Endoscopy;  Laterality: N/A;   COLONOSCOPY WITH PROPOFOL N/A 11/13/2016   Procedure: COLONOSCOPY WITH PROPOFOL;  Surgeon: Manya Silvas, MD;  Location: [facility] ENDOSCOPY;  Service: Endoscopy;  Laterality: N/A;   NO PAST SURGERIES     TOOTH EXTRACTION      Family History  Problem Relation Age of Onset   Prostate cancer Brother        Father   Diabetes Brother    Colon cancer Brother    Pancreatic cancer Sister    Lung cancer Sister    Coronary artery disease Father    Heart disease Father    Congestive Heart Failure Mother    Diabetes Mother     Social History   Socioeconomic History   Marital status: Married    Spouse name: Not on file   Number of children: 1   Years of education: Not on file   Highest education level: High school graduate  Occupational History   Occupation: retired  Tobacco Use   Smoking status: Former    Years: 14.00  Types: Cigarettes    Quit date: 07/08/1973    Years since quitting: 48.1   Smokeless tobacco: Never  Vaping Use   Vaping Use: Never used  Substance and Sexual Activity   Alcohol use: No    Alcohol/week: 0.0 standard drinks   Drug use: No   Sexual activity: Not on file  Other Topics Concern   Not on file  Social History Narrative   Not on file   Social Determinants of Health   Financial Resource Strain: Not on file  Food Insecurity: Not on file  Transportation Needs: Not on file  Physical Activity: Not on file  Stress: Not on file  Social Connections: Not on file  Intimate Partner Violence: Not on file    Outpatient Medications Prior to Visit  Medication Sig Dispense Refill   amLODipine (NORVASC) 10 MG tablet TAKE 1 TABLET(10 MG)  BY MOUTH DAILY 90 tablet 0   aspirin 81 MG EC tablet Take 81 mg by mouth daily. Swallow whole.     atorvastatin (LIPITOR) 20 MG tablet TAKE 1 TABLET(20 MG) BY MOUTH DAILY 90 tablet 3   benazepril (LOTENSIN) 40 MG tablet TAKE 1 TABLET BY MOUTH EVERY DAY 90 tablet 3   blood glucose meter kit and supplies Dispense based on patient and insurance preference (One Touch Ultra 2). Use once daily as directed. (FOR ICD-10 E11.9). 1 each 0   glucose blood test strip Check Blood Sugars Once Daily 100 each 12   hydrochlorothiazide (HYDRODIURIL) 25 MG tablet Take 1 tablet (25 mg total) by mouth daily. 90 tablet 3   Lancets MISC Check Blood Sugars Once Daily 100 each 12   latanoprost (XALATAN) 0.005 % ophthalmic solution Place 1 drop into both eyes at bedtime.     metFORMIN (GLUCOPHAGE) 1000 MG tablet TAKE 1 TABLET(1000 MG) BY MOUTH TWICE DAILY 180 tablet 1   pioglitazone (ACTOS) 45 MG tablet TAKE 1 TABLET(45 MG) BY MOUTH DAILY 90 tablet 0   No facility-administered medications prior to visit.    No Known Allergies  Review of Systems  Constitutional:  Negative for fatigue and fever.  Respiratory:  Negative for cough and shortness of breath.   Cardiovascular:  Negative for chest pain, palpitations and leg swelling.  Neurological:  Negative for dizziness and headaches.  Psychiatric/Behavioral:  Positive for behavioral problems, decreased concentration and sleep disturbance. The patient is nervous/anxious.       Objective:    Physical Exam Constitutional:      General: He is awake.     Appearance: He is well-developed.  HENT:     Head: Normocephalic.  Eyes:     Conjunctiva/sclera: Conjunctivae normal.  Cardiovascular:     Rate and Rhythm: Normal rate and regular rhythm.     Heart sounds: Normal heart sounds.  Pulmonary:     Effort: Pulmonary effort is normal.     Breath sounds: Normal breath sounds.  Skin:    General: Skin is warm.  Neurological:     Mental Status: He is alert and oriented  to person, place, and time.  Psychiatric:        Attention and Perception: Attention normal.        Mood and Affect: Mood normal.        Speech: Speech normal.        Behavior: Behavior is cooperative.    BP 112/62 (BP Location: Left Arm, Patient Position: Sitting, Cuff Size: Normal)    Pulse 81    Temp 98.8 F (37.1  C) (Oral)    Ht _0  (1.803 m)    Wt 173 lb 12.8 oz (78.8 kg)    SpO2 100%    BMI 24.24 kg/m  Wt Readings from Last 3 Encounters:  08/31/21 173 lb 12.8 oz (78.8 kg)  06/05/21 191 lb 9.6 oz (86.9 kg)  03/13/21 187 lb (84.8 kg)    Health Maintenance Due  Topic Date Due   COVID-19 Vaccine (4 - Booster for Pfizer series) 08/17/2020   FOOT EXAM  09/28/2020   OPHTHALMOLOGY EXAM  11/02/2020    There are no preventive care reminders to display for this patient.   Lab Results  Component Value Date   TSH 2.140 01/31/2021   Lab Results  Component Value Date   WBC 9.5 01/31/2021   HGB 11.9 (L) 01/31/2021   HCT 36.0 (L) 01/31/2021   MCV 96 01/31/2021   PLT 267 01/31/2021   Lab Results  Component Value Date   NA 139 01/31/2021   K 4.9 01/31/2021   CO2 20 01/31/2021   GLUCOSE 133 (H) 01/31/2021   BUN 20 01/31/2021   CREATININE 1.26 01/31/2021   BILITOT 0.7 01/31/2021   ALKPHOS 64 01/31/2021   AST 8 01/31/2021   ALT 7 01/31/2021   PROT 7.4 01/31/2021   ALBUMIN 4.3 01/31/2021   CALCIUM 9.7 01/31/2021   EGFR 59 (L) 01/31/2021   Lab Results  Component Value Date   CHOL 104 01/31/2021   Lab Results  Component Value Date   HDL 35 (L) 01/31/2021   Lab Results  Component Value Date   LDLCALC 52 01/31/2021   Lab Results  Component Value Date   TRIG 87 01/31/2021   Lab Results  Component Value Date   CHOLHDL 3.0 01/31/2021   Lab Results  Component Value Date   HGBA1C 6.4 (A) 06/05/2021       Assessment & Plan:   Problem List Items Addressed This Visit       Other   GAD (generalized anxiety disorder) - Primary    GAD w/ obsessive nearly  paranoid components. Mental status exam today wnl for age.  Discussed therapy, psychiatry. Pt amenable to therapy would like to hold off psychiatry.  Will start with zoloft 50 mg and monitor closely.  Family is very supportive. F/u 6 weeks      Relevant Medications   sertraline (ZOLOFT) 50 MG tablet   Other Relevant Orders   Ambulatory referral to Psychology   Insomnia    Discussed starting Ramelteon at night, may have the best side effect profile for his age. When questioned on history of sleep apnea (currently does not wear mask, lost weight) pt became worried that this mediation would make him die in his sleep.   For now we will hold off, recommended 5 mg of melatonin every night.      Relevant Medications   sertraline (ZOLOFT) 50 MG tablet   Other Relevant Orders   Ambulatory referral to Psychology   Post-nasal drip    rx azelastine to try before bed Pt has flonase at home, encouraged this in AM.       Relevant Medications   azelastine (ASTELIN) 0.1 % nasal spray  40 minutes was spent with the patient and family discussing symptoms, medications, therapy, psychiatry and overall plan   I, Juan Kirschner, PA-C have reviewed all documentation for this visit. The documentation on  08/31/2021 for the exam, diagnosis, procedures, and orders are all accurate and complete.   Juan Kirschner, PA-C  [facility] [address] [place], [place], [address] Office: [phone] Fax: [phone]

## 2021-09-03 ENCOUNTER — Other Ambulatory Visit: Payer: Self-pay | Admitting: Physician Assistant

## 2021-09-03 DIAGNOSIS — F411 Generalized anxiety disorder: Secondary | ICD-10-CM

## 2021-09-03 DIAGNOSIS — F5105 Insomnia due to other mental disorder: Secondary | ICD-10-CM

## 2021-09-04 ENCOUNTER — Other Ambulatory Visit: Payer: Self-pay | Admitting: Family Medicine

## 2021-09-04 NOTE — Telephone Encounter (Signed)
Requested medication (s) are due for refill today:   Yes  Requested medication (s) are on the active medication list:   Yes  Future visit scheduled:   Yes   Last ordered: 03/11/2021 #180, 1 refill  Returned because protocol criteria not met.   Needs B12    Requested Prescriptions  Pending Prescriptions Disp Refills   metFORMIN (GLUCOPHAGE) 1000 MG tablet [Pharmacy Med Name: METFORMIN 1000MG TABLETS] 180 tablet 1    Sig: TAKE 1 TABLET(1000 MG) BY MOUTH TWICE DAILY     Endocrinology:  Diabetes - Biguanides Failed - 09/04/2021  3:12 AM      Failed - eGFR in normal range and within 360 days    GFR calc Af Amer  Date Value Ref Range Status  04/05/2020 60 >59 mL/min/1.73 Final    Comment:    **[facility] currently reports eGFR in compliance with the current**   recommendations of the [employer]. [facility] will   update reporting as new guidelines are published from the NKF-ASN   Task force.    GFR calc non Af Amer  Date Value Ref Range Status  04/05/2020 52 (L) >59 mL/min/1.73 Final   eGFR  Date Value Ref Range Status  01/31/2021 59 (L) >59 mL/min/1.73 Final          Failed - B12 Level in normal range and within 720 days    No results found for: VITAMINB12        Passed - Cr in normal range and within 360 days    Creatinine, Ser  Date Value Ref Range Status  01/31/2021 1.26 0.76 - 1.27 mg/dL Final          Passed - HBA1C is between 0 and 7.9 and within 180 days    Hemoglobin A1C  Date Value Ref Range Status  06/05/2021 6.4 (A) 4.0 - 5.6 % Final   Hgb A1c MFr Bld  Date Value Ref Range Status  01/31/2021 6.4 (H) 4.8 - 5.6 % Final    Comment:             Prediabetes: 5.7 - 6.4          Diabetes: >6.4          Glycemic control for adults with diabetes: <7.0           Passed - Valid encounter within last 6 months    Recent Outpatient Visits           4 days ago GAD (generalized anxiety disorder)   [employer], [place], PA-C    3 months ago Type 2 diabetes mellitus without complication, without long-term current use of insulin ([place])   [facility] Jerrol Banana., MD   7 months ago Type 2 diabetes mellitus without complication, without long-term current use of insulin [facility])   [facility] Jerrol Banana., MD   11 months ago Type 2 diabetes mellitus without complication, without long-term current use of insulin [facility])   [facility] Jerrol Banana., MD   1 year ago Annual physical exam   [facility] Jerrol Banana., MD       Future Appointments             In 4 weeks Jerrol Banana., MD [facility] [place]   In 1 month Thedore Mins, Ria Comment, PA-C [facility]            Passed - CBC within  normal limits and completed in the last 12 months    WBC  Date Value Ref Range Status  01/31/2021 9.5 3.4 - 10.8 x10E3/uL Final   RBC  Date Value Ref Range Status  01/31/2021 3.75 (L) 4.14 - 5.80 x10E6/uL Final   Hemoglobin  Date Value Ref Range Status  01/31/2021 11.9 (L) 13.0 - 17.7 g/dL Final   Hematocrit  Date Value Ref Range Status  01/31/2021 36.0 (L) 37.5 - 51.0 % Final   MCHC  Date Value Ref Range Status  01/31/2021 33.1 31.5 - 35.7 g/dL Final   [facility]  Date Value Ref Range Status  01/31/2021 31.7 26.6 - 33.0 pg Final   MCV  Date Value Ref Range Status  01/31/2021 96 79 - 97 fL Final   No results found for: PLTCOUNTKUC, LABPLAT, POCPLA RDW  Date Value Ref Range Status  01/31/2021 13.4 11.6 - 15.4 % Final

## 2021-09-05 ENCOUNTER — Other Ambulatory Visit: Payer: Self-pay | Admitting: Physician Assistant

## 2021-09-05 ENCOUNTER — Telehealth: Payer: Self-pay

## 2021-09-05 ENCOUNTER — Ambulatory Visit: Payer: Self-pay

## 2021-09-05 DIAGNOSIS — F99 Mental disorder, not otherwise specified: Secondary | ICD-10-CM

## 2021-09-05 DIAGNOSIS — F5105 Insomnia due to other mental disorder: Secondary | ICD-10-CM

## 2021-09-05 MED ORDER — RAMELTEON 8 MG PO TABS: ORAL_TABLET | ORAL | 1 refills | Status: DC

## 2021-09-05 NOTE — Telephone Encounter (Signed)
Copied from [place] [phone]. Topic: General - Other ?>> Sep 05, 2021  9:08 AM Valere Dross wrote: ?Reason for CRM: Pts daughter called in stating she is needing Dr. Thedore Mins or nurse to give her a call asap, she states its about the pt and he is not getting better. Please advise. ?

## 2021-09-05 NOTE — Progress Notes (Signed)
Had discussed w/ family trying Ramelteon for insomnia ?Originally did not want, but pts family called for a trial. ?Avoid with etoh ?

## 2021-09-05 NOTE — Telephone Encounter (Signed)
?  Chief Complaint: Not sleeping per daughter. Pt. Would like to try the Ramelteon recommended by Ms. Drubel.  ?Symptoms: Insomnia causing anxiety ?Frequency: Started in November per daughter ?Pertinent Negatives: Patient denies  ?Disposition: [] ED /[] Urgent Care (no appt availability in office) / [] Appointment(In office/virtual)/ []  [place] [facility]/ [] Home Care/ [] Refused Recommended Disposition /[] [place] [facility]/ []  Follow-up with PCP ?Additional Notes: Please advise daughter Jeral Fruit, if prescription is sent in. [phone] ?Answer Assessment - Initial Assessment Questions ?1. DESCRIPTION: "Tell me about your sleeping problem."  ?    Not sleeping ?2. ONSET: "How long have you been having trouble sleeping?" (e.g., days, weeks, months) ?    Weeks ?3. RECURRENT: "Have you had sleeping problems before?"  If Yes, ask: "What happened that time?" "What helped your sleeping problem go away in the past?"  ?    Not sleeping ?4. STRESS: "Is there anything in your life that is making you feel stressed or tense?" ?    No ?5. PAIN: "Do you have any pain that is keeping you awake?" (e.g., back pain, headache, abdominal pain) ?    No ?6. CAFFEINE ABUSE: "Do you drink caffeinated beverages, and how much each day?" (e.g., coffee, tea, colas) ?    No ?7. ALCOHOL USE OR SUBSTANCE USE (DRUG USE): "Do you drink alcohol or use any illegal drugs?" ?    No ?8. OTHER SYMPTOMS: "Do you have any other symptoms?"  (e.g., difficulty breathing) ?    Anxiety ? ?Protocols used: Insomnia-A-AH ? ?

## 2021-09-10 ENCOUNTER — Ambulatory Visit: Payer: Self-pay | Admitting: *Deleted

## 2021-09-10 NOTE — Telephone Encounter (Signed)
?  Chief Complaint: Rozerem 8 mg is not helping him sleep.  It's also very expensive $100 co-pay.    Requesting an alternative preferably something cheaper. ?Symptoms: Slept 5 hrs the first night it was taken but now only 2 hrs and he is very sleep deprived. ?Frequency: nightly.   Anxiety is high due to lack of sleep. ?Pertinent Negatives: Patient denies N/A ?Disposition: '[]'$ ED /'[]'$ Urgent Care (no appt availability in office) / '[]'$ Appointment(In office/virtual)/ '[]'$  [place] [facility]/ '[]'$ Home Care/ '[]'$ Refused Recommended Disposition /'[]'$ [place] [facility]/ '[x]'$  Follow-up with PCP ?Additional Notes: Message sent to [employer].   Someone will be contacting Leveda Anna, daughter back.   She was agreeable to this plan.  [phone] is Jodi's number.  ?

## 2021-09-10 NOTE — Telephone Encounter (Signed)
Please advise 

## 2021-09-10 NOTE — Telephone Encounter (Signed)
I returned Jodi's call.  C/o her father not sleeping well with the new medication.   Requesting an alternative. ? ? ?Reason for Disposition ? [1] Caller has URGENT medicine question about med that PCP or specialist prescribed AND [2] triager unable to answer question ?   Rozerem 8 mg not helping him sleep. ? ?Answer Assessment - Initial Assessment Questions ?1. NAME of MEDICATION: "What medicine are you calling about?" ?    *No Answer* ?2. QUESTION: "What is your question?" (e.g., double dose of medicine, side effect) ?    He slept for 5 hrs on first night after that only 2 hrs sleep.   Not what we were hoping for.   He is so sleep deprived that the anxiety is worse.    ?3. PRESCRIBING HCP: "Who prescribed it?" Reason: if prescribed by specialist, call should be referred to that group. ?    Mikey Kirschner ?4. SYMPTOMS: "Do you have any symptoms?" ?    Severe   He is not sleeping and the anxiety is terrible since he is sleep deprived. ?5. SEVERITY: If symptoms are present, ask "Are they mild, moderate or severe?" ?    Severe    ?6. PREGNANCY:  "Is there any chance that you are pregnant?" "When was your last menstrual period?" ?    N/A ? ?Protocols used: Medication Question Call-A-AH ? ?

## 2021-09-11 ENCOUNTER — Other Ambulatory Visit: Payer: PPO

## 2021-09-11 ENCOUNTER — Other Ambulatory Visit: Payer: Self-pay | Admitting: *Deleted

## 2021-09-11 ENCOUNTER — Other Ambulatory Visit: Payer: Self-pay

## 2021-09-11 DIAGNOSIS — C61 Malignant neoplasm of prostate: Secondary | ICD-10-CM | POA: Diagnosis not present

## 2021-09-11 DIAGNOSIS — F5105 Insomnia due to other mental disorder: Secondary | ICD-10-CM

## 2021-09-11 MED ORDER — TRAZODONE HCL 50 MG PO TABS: 50.0000 mg | ORAL_TABLET | Freq: Every day | ORAL | 2 refills | Status: DC

## 2021-09-11 NOTE — Telephone Encounter (Signed)
Rx was sent to pharmacy. Patient's daughter Leveda Anna was advised. ?

## 2021-09-12 ENCOUNTER — Telehealth: Payer: Self-pay

## 2021-09-12 LAB — PSA: Prostate Specific Ag, Serum: 3.2 ng/mL (ref 0.0–4.0)

## 2021-09-12 NOTE — Telephone Encounter (Signed)
Pt aware and will keep appointment as scheduled ?

## 2021-09-12 NOTE — Telephone Encounter (Signed)
-----   Message from Hollice Espy, MD sent at 09/12/2021  8:03 AM EST ----- ?PSA is up slightly but overall stable.  We will recheck in 6 months as planned. ? ?Hollice Espy, MD ? ?

## 2021-10-02 ENCOUNTER — Ambulatory Visit: Payer: PPO | Admitting: Psychologist

## 2021-10-02 NOTE — Progress Notes (Signed)
?  ? ? ?Established patient visit ? ? ?Patient: Juan Mack   DOB: [birth year]   76 y.o. Male  MRN: [id number] ?Visit Date: 10/03/2021 ? ?Today's healthcare provider: Wilhemena Durie, MD  ? ?Chief Complaint  ?Patient presents with  ? Diabetes  ? Hypertension  ? Hyperlipidemia  ? Anxiety  ? ?Subjective  ?  ?HPI  ?Patient comes in today for follow-up.  He has been feeling fairly well.  A little bit anxious of late. ?He thinks the sertraline has helped a good bit. ?Diabetes Mellitus Type II, follow-up ? ?Lab Results  ?Component Value Date  ? HGBA1C 6.4 (A) 06/05/2021  ? HGBA1C 6.4 (H) 01/31/2021  ? HGBA1C 6.5 (A) 10/03/2020  ? ?Last seen for diabetes 4 months ago.  ?Management since then includes continuing the same treatment. ?He reports excellent compliance with treatment. ?He is not having side effects.  ? ?Home blood sugar records: fasting range: 130 ? ?Episodes of hypoglycemia? No  ?  ?Current insulin regiment: none ?Most Recent Eye Exam: within past 12 months ? ?--------------------------------------------------------------------------------------------------- ?Hypertension, follow-up ? ?BP Readings from Last 3 Encounters:  ?10/03/21 (!) 121/51  ?08/31/21 112/62  ?06/05/21 (!) 159/68  ? Wt Readings from Last 3 Encounters:  ?10/03/21 177 lb 12.8 oz (80.6 kg)  ?08/31/21 173 lb 12.8 oz (78.8 kg)  ?06/05/21 191 lb 9.6 oz (86.9 kg)  ?  ? ?He was last seen for hypertension 4 months ago.  ?BP at that visit was 159/68. ?Management since that visit includes; Well-controlled on amlodipine 10 and Lotensin 40. ?He reports excellent compliance with treatment. ?He is not having side effects.  ?He is exercising. ?He is adherent to low salt diet.   ?Outside blood pressures are not checked. ? ?He does not smoke. ? ?Use of agents associated with hypertension: NSAIDS.  ? ?--------------------------------------------------------------------------------------------------- ?Lipid/Cholesterol, follow-up ? ?Last Lipid Panel: ?Lab  Results  ?Component Value Date  ? CHOL 104 01/31/2021  ? [facility] 52 01/31/2021  ? HDL 35 (L) 01/31/2021  ? TRIG 87 01/31/2021  ? ? ?He was last seen for this 8 months ago.  ?Management since that visit includes; Atorvastatin 20. ? ?He reports excellent compliance with treatment. ?He is not having side effects.  ? ?He is following a Regular diet. ?Current exercise: no regular exercise ? ?Last metabolic panel ?Lab Results  ?Component Value Date  ? GLUCOSE 133 (H) 01/31/2021  ? NA 139 01/31/2021  ? K 4.9 01/31/2021  ? BUN 20 01/31/2021  ? CREATININE 1.26 01/31/2021  ? EGFR 59 (L) 01/31/2021  ? GFRNONAA 52 (L) 04/05/2020  ? CALCIUM 9.7 01/31/2021  ? AST 8 01/31/2021  ? ALT 7 01/31/2021  ? ?The ASCVD Risk score (Arnett DK, et al., 2019) failed to calculate for the following reasons: ?  The valid total cholesterol range is 130 to 320 mg/dL ? ?--------------------------------------------------------------------------------------------------- ?Anxiety, Follow-up ? ?He was last seen for anxiety 1 months ago. ?Changes made at last visit include; seen by Sharen Counter. Started Zoloft and referred to Psychology. ?  ?He reports excellent compliance with treatment. ?He reports excellent tolerance of treatment. ?He is not having side effects.  ? ?He feels his anxiety is mild and Improved since last visit. ? ?Symptoms: ?No chest pain No difficulty concentrating  ?No dizziness No fatigue  ?No feelings of losing control No insomnia  ?No irritable No palpitations  ?No panic attacks No racing thoughts  ?No shortness of breath No sweating  ?No tremors/shakes   ? ?GAD-7 Results ? ?  08/31/2021  ? 11:34 AM  ?GAD-7 Generalized Anxiety Disorder Screening Tool  ?1. Feeling Nervous, Anxious, or on Edge 3  ?2. Not Being Able to Stop or Control Worrying 3  ?3. Worrying Too Much About Different Things 3  ?4. Trouble Relaxing 3  ?5. Being So Restless it's Hard To Sit Still 3  ?6. Becoming Easily Annoyed or Irritable 3  ?7. Feeling Afraid As If  Something Awful Might Happen 3  ?Total GAD-7 Score 21  ?Difficulty At Work, Home, or Getting  Along With Others? Very difficult  ? ? ?PHQ-9 Scores ? ?  08/31/2021  ? 10:55 AM 03/30/2020  ?  2:03 PM 03/29/2019  ?  2:10 PM  ?PHQ9 SCORE ONLY  ?PHQ-9 Total Score 23 0 0  ? ? ?--------------------------------------------------------------------------------------------------- ? ? ?Medications: ?Outpatient Medications Prior to Visit  ?Medication Sig  ? amLODipine (NORVASC) 10 MG tablet TAKE 1 TABLET(10 MG) BY MOUTH DAILY  ? aspirin 81 MG EC tablet Take 81 mg by mouth daily. Swallow whole.  ? atorvastatin (LIPITOR) 20 MG tablet TAKE 1 TABLET(20 MG) BY MOUTH DAILY  ? azelastine (ASTELIN) 0.1 % nasal spray Place 1 spray into both nostrils 2 (two) times daily. Use in each nostril as directed  ? benazepril (LOTENSIN) 40 MG tablet TAKE 1 TABLET BY MOUTH EVERY DAY  ? blood glucose meter kit and supplies Dispense based on patient and insurance preference (One Touch Ultra 2). Use once daily as directed. (FOR ICD-10 E11.9).  ? glucose blood test strip Check Blood Sugars Once Daily  ? hydrochlorothiazide (HYDRODIURIL) 25 MG tablet Take 1 tablet (25 mg total) by mouth daily.  ? Lancets MISC Check Blood Sugars Once Daily  ? latanoprost (XALATAN) 0.005 % ophthalmic solution Place 1 drop into both eyes at bedtime.  ? metFORMIN (GLUCOPHAGE) 1000 MG tablet TAKE 1 TABLET(1000 MG) BY MOUTH TWICE DAILY  ? pioglitazone (ACTOS) 45 MG tablet TAKE 1 TABLET(45 MG) BY MOUTH DAILY  ? ramelteon (ROZEREM) 8 MG tablet Take 1/2 a tablet up to a full tablet daily at bedtime. Start with 1/2 tablet.  ? sertraline (ZOLOFT) 50 MG tablet Take 1 tablet (50 mg total) by mouth daily.  ? traZODone (DESYREL) 50 MG tablet Take 1 tablet (50 mg total) by mouth at bedtime.  ? ?No facility-administered medications prior to visit.  ? ? ?Review of Systems  ?Constitutional:  Negative for appetite change, chills and fever.  ?Respiratory:  Negative for chest tightness,  shortness of breath and wheezing.   ?Cardiovascular:  Negative for chest pain and palpitations.  ?Gastrointestinal:  Negative for abdominal pain, nausea and vomiting.  ? ?Last hemoglobin A1c ?Lab Results  ?Component Value Date  ? HGBA1C 7.0 (H) 10/03/2021  ? ?  ?  Objective  ?  ?BP (!) 121/51   Pulse 69   Resp 16   Ht 5' 11"  (1.803 m)   Wt 177 lb 12.8 oz (80.6 kg)   SpO2 100%   BMI 24.80 kg/m?  ?BP Readings from Last 3 Encounters:  ?10/03/21 (!) 121/51  ?08/31/21 112/62  ?06/05/21 (!) 159/68  ? ?Wt Readings from Last 3 Encounters:  ?10/03/21 177 lb 12.8 oz (80.6 kg)  ?08/31/21 173 lb 12.8 oz (78.8 kg)  ?06/05/21 191 lb 9.6 oz (86.9 kg)  ? ?  ? ?Physical Exam ?Vitals reviewed.  ?Constitutional:   ?   Appearance: Normal appearance. He is well-developed.  ?HENT:  ?   Head: Normocephalic and atraumatic.  ?   Right Ear: External  ear normal.  ?   Left Ear: External ear normal.  ?   Nose: Nose normal.  ?Eyes:  ?   Conjunctiva/sclera: Conjunctivae normal.  ?   Pupils: Pupils are equal, round, and reactive to light.  ?Cardiovascular:  ?   Rate and Rhythm: Normal rate and regular rhythm.  ?   Heart sounds: Normal heart sounds.  ?Pulmonary:  ?   Effort: Pulmonary effort is normal.  ?   Breath sounds: Normal breath sounds.  ?Abdominal:  ?   General: Bowel sounds are normal.  ?   Palpations: Abdomen is soft.  ?Musculoskeletal:  ?   Cervical back: Normal range of motion and neck supple.  ?Skin: ?   General: Skin is warm and dry.  ?Neurological:  ?   General: No focal deficit present.  ?   Mental Status: He is alert and oriented to person, place, and time.  ?Psychiatric:     ?   Mood and Affect: Mood normal.     ?   Behavior: Behavior normal.     ?   Thought Content: Thought content normal.     ?   Judgment: Judgment normal.  ?  ? ? ?No results found for any visits on 10/03/21. ? Assessment & Plan  ?  ? ?1. Type 2 diabetes mellitus without complication, without long-term current use of insulin ([place]) ?Goal A1c less than  7-7.5.  No hypoglycemia in this patient on metformin and pioglitazone. ?- Lipid panel ?- TSH ?- CBC w/Diff/Platelet ?- Comprehensive Metabolic Panel (CMET) ?- Hemoglobin A1c ? ?2. GAD (generalized anxiety disorder) ?Improve

## 2021-10-03 ENCOUNTER — Encounter: Payer: Self-pay | Admitting: Family Medicine

## 2021-10-03 ENCOUNTER — Ambulatory Visit (INDEPENDENT_AMBULATORY_CARE_PROVIDER_SITE_OTHER): Payer: PPO | Admitting: Family Medicine

## 2021-10-03 ENCOUNTER — Other Ambulatory Visit: Payer: Self-pay

## 2021-10-03 VITALS — BP 121/51 | HR 69 | Resp 16 | Ht 71.0 in | Wt 177.8 lb

## 2021-10-03 DIAGNOSIS — E7800 Pure hypercholesterolemia, unspecified: Secondary | ICD-10-CM

## 2021-10-03 DIAGNOSIS — J069 Acute upper respiratory infection, unspecified: Secondary | ICD-10-CM

## 2021-10-03 DIAGNOSIS — I10 Essential (primary) hypertension: Secondary | ICD-10-CM | POA: Diagnosis not present

## 2021-10-03 DIAGNOSIS — G4733 Obstructive sleep apnea (adult) (pediatric): Secondary | ICD-10-CM | POA: Diagnosis not present

## 2021-10-03 DIAGNOSIS — E119 Type 2 diabetes mellitus without complications: Secondary | ICD-10-CM

## 2021-10-03 DIAGNOSIS — F411 Generalized anxiety disorder: Secondary | ICD-10-CM

## 2021-10-04 LAB — TSH: TSH: 1.54 u[IU]/mL (ref 0.450–4.500)

## 2021-10-04 LAB — CBC WITH DIFFERENTIAL/PLATELET
Basophils Absolute: 0.1 10*3/uL (ref 0.0–0.2)
Basos: 1 %
EOS (ABSOLUTE): 0.3 10*3/uL (ref 0.0–0.4)
Eos: 3 %
Hematocrit: 30.8 % — ABNORMAL LOW (ref 37.5–51.0)
Hemoglobin: 10.2 g/dL — ABNORMAL LOW (ref 13.0–17.7)
Immature Grans (Abs): 0 10*3/uL (ref 0.0–0.1)
Immature Granulocytes: 0 %
Lymphocytes Absolute: 1.9 10*3/uL (ref 0.7–3.1)
Lymphs: 19 %
MCH: 31.9 pg (ref 26.6–33.0)
MCHC: 33.1 g/dL (ref 31.5–35.7)
MCV: 96 fL (ref 79–97)
Monocytes Absolute: 1 10*3/uL — ABNORMAL HIGH (ref 0.1–0.9)
Monocytes: 10 %
Neutrophils Absolute: 6.8 10*3/uL (ref 1.4–7.0)
Neutrophils: 67 %
Platelets: 398 10*3/uL (ref 150–450)
RBC: 3.2 x10E6/uL — ABNORMAL LOW (ref 4.14–5.80)
RDW: 13.2 % (ref 11.6–15.4)
WBC: 10 10*3/uL (ref 3.4–10.8)

## 2021-10-04 LAB — COMPREHENSIVE METABOLIC PANEL
ALT: 9 IU/L (ref 0–44)
AST: 11 IU/L (ref 0–40)
Albumin/Globulin Ratio: 1.7 (ref 1.2–2.2)
Albumin: 4 g/dL (ref 3.7–4.7)
Alkaline Phosphatase: 68 IU/L (ref 44–121)
BUN/Creatinine Ratio: 18 (ref 10–24)
BUN: 23 mg/dL (ref 8–27)
Bilirubin Total: 0.3 mg/dL (ref 0.0–1.2)
CO2: 21 mmol/L (ref 20–29)
Calcium: 9.5 mg/dL (ref 8.6–10.2)
Chloride: 103 mmol/L (ref 96–106)
Creatinine, Ser: 1.25 mg/dL (ref 0.76–1.27)
Globulin, Total: 2.3 g/dL (ref 1.5–4.5)
Glucose: 121 mg/dL — ABNORMAL HIGH (ref 70–99)
Potassium: 4.9 mmol/L (ref 3.5–5.2)
Sodium: 136 mmol/L (ref 134–144)
Total Protein: 6.3 g/dL (ref 6.0–8.5)
eGFR: 60 mL/min/{1.73_m2} (ref >59)

## 2021-10-04 LAB — LIPID PANEL
Chol/HDL Ratio: 3.6 ratio (ref 0.0–5.0)
Cholesterol, Total: 116 mg/dL (ref 100–199)
HDL: 32 mg/dL — ABNORMAL LOW (ref >39)
LDL Chol Calc (NIH): 65 mg/dL (ref 0–99)
Triglycerides: 101 mg/dL (ref 0–149)
VLDL Cholesterol Cal: 19 mg/dL (ref 5–40)

## 2021-10-04 LAB — HEMOGLOBIN A1C
Est. average glucose Bld gHb Est-mCnc: 154 mg/dL
Hgb A1c MFr Bld: 7 % — ABNORMAL HIGH (ref 4.8–5.6)

## 2021-10-08 ENCOUNTER — Other Ambulatory Visit: Payer: Self-pay | Admitting: *Deleted

## 2021-10-08 DIAGNOSIS — D649 Anemia, unspecified: Secondary | ICD-10-CM

## 2021-10-12 ENCOUNTER — Ambulatory Visit: Payer: PPO | Admitting: Physician Assistant

## 2021-10-16 DIAGNOSIS — H2511 Age-related nuclear cataract, right eye: Secondary | ICD-10-CM | POA: Diagnosis not present

## 2021-10-16 DIAGNOSIS — H401131 Primary open-angle glaucoma, bilateral, mild stage: Secondary | ICD-10-CM | POA: Diagnosis not present

## 2021-10-16 LAB — HM DIABETES EYE EXAM

## 2021-10-30 ENCOUNTER — Telehealth: Payer: Self-pay | Admitting: Family Medicine

## 2021-10-30 NOTE — Telephone Encounter (Signed)
Copied from [place] [id number]. Topic: Medicare AWV ?>> Oct 30, 2021 11:27 AM Cher Nakai R wrote: ?Reason for CRM:  ?Left message for patient to call back and schedule Medicare Annual Wellness Visit (AWV) in office.  ? ?If unable to come into the office for AWV,  please offer to do virtually or by telephone. ? ?Last AWV: 03/30/2020 ? ?Please schedule at anytime with [facility] Health Advisor. ? ?30 minute appointment for Virtual or phone ?45 minute appointment for in office or Initial virtual/phone ? ?Any questions, please contact me at [phone] ?

## 2021-11-02 ENCOUNTER — Other Ambulatory Visit: Payer: Self-pay | Admitting: Family Medicine

## 2021-11-02 DIAGNOSIS — I10 Essential (primary) hypertension: Secondary | ICD-10-CM

## 2021-11-11 ENCOUNTER — Other Ambulatory Visit: Payer: Self-pay | Admitting: Family Medicine

## 2021-11-11 DIAGNOSIS — E119 Type 2 diabetes mellitus without complications: Secondary | ICD-10-CM

## 2021-11-12 NOTE — Telephone Encounter (Signed)
Requested Prescriptions  ?Pending Prescriptions Disp Refills  ?? pioglitazone (ACTOS) 45 MG tablet [Pharmacy Med Name: PIOGLITAZONE '45MG'$  TABLETS] 90 tablet 0  ?  Sig: TAKE 1 TABLET(45 MG) BY MOUTH DAILY  ?  ? Endocrinology:  Diabetes - Glitazones - pioglitazone Passed - 11/11/2021  3:12 AM  ?  ?  Passed - HBA1C is between 0 and 7.9 and within 180 days  ?  Hgb A1c MFr Bld  ?Date Value Ref Range Status  ?10/03/2021 7.0 (H) 4.8 - 5.6 % Final  ?  Comment:  ?           Prediabetes: 5.7 - 6.4 ?         Diabetes: >6.4 ?         Glycemic control for adults with diabetes: <7.0 ?  ?   ?  ?  Passed - Valid encounter within last 6 months  ?  Recent Outpatient Visits   ?      ? 1 month ago Type 2 diabetes mellitus without complication, without long-term current use of insulin ([place])  ? [facility] Jerrol Banana., MD  ? 2 months ago GAD (generalized anxiety disorder)  ? [facility] Thedore Mins, Ria Comment, PA-C  ? 5 months ago Type 2 diabetes mellitus without complication, without long-term current use of insulin ([place])  ? [facility] Jerrol Banana., MD  ? 9 months ago Type 2 diabetes mellitus without complication, without long-term current use of insulin ([place])  ? [facility] Jerrol Banana[facility] MD  ? 1 year ago Type 2 diabetes mellitus without complication, without long-term current use of insulin ([place])  ? [facility] Jerrol Banana., MD  ?  ?  ?Future Appointments   ?        ? In 3 months Jerrol Banana., MD [facility]  ?  ? ?  ?  ?  ? ? ?

## 2021-12-20 ENCOUNTER — Telehealth: Payer: Self-pay | Admitting: Family Medicine

## 2021-12-20 NOTE — Telephone Encounter (Signed)
Copied from [place] [id number]. Topic: Medicare AWV >> Dec 20, 2021  3:24 PM Jae Dire wrote: Reason for CRM:  Left message for patient to call back and schedule Medicare Annual Wellness Visit (AWV) in office.   If unable to come into the office for AWV,  please offer to do virtually or by telephone.  Last AWV  03/30/2020   Please schedule at anytime with [facility] Health Advisor.  30 minute appointment for Virtual or phone 45 minute appointment for in office or Initial virtual/phone  Any questions, please contact me at [phone]

## 2021-12-27 ENCOUNTER — Ambulatory Visit (INDEPENDENT_AMBULATORY_CARE_PROVIDER_SITE_OTHER): Payer: PPO

## 2021-12-27 VITALS — Wt 177.0 lb

## 2021-12-27 DIAGNOSIS — Z0000 Encounter for general adult medical examination without abnormal findings: Secondary | ICD-10-CM | POA: Diagnosis not present

## 2021-12-27 NOTE — Patient Instructions (Signed)
Juan Mack , Thank you for taking time to come for your Medicare Wellness Visit. I appreciate your ongoing commitment to your health goals. Please review the following plan we discussed and let me know if I can assist you in the future.   Screening recommendations/referrals: Colonoscopy: declined referral Recommended yearly ophthalmology/optometry visit for glaucoma screening and checkup 77 Years and Older, Male Preventive care refers to lifestyle choices and visits with your health care provider that can promote health and wellness. for hygiene and checkup  Vaccinations: Influenza vaccine: 05/09/21 Pneumococcal vaccine: 12/14/13 Tdap vaccine: 02/13/15 Shingles vaccine: n/d   Covid-19: 08/25/19, 09/15/19, 06/22/20  Advanced directives: no  Conditions/risks identified:none  Next appointment: Follow up in one year for your annual wellness visit. 12/30/22 @ 11:30 am by phone  Preventive Care 77 Years and Older, Male Preventive care refers to lifestyle choices and visits with your health care provider that can promote health and wellness. What does preventive care include? A yearly physical exam. This is also called an annual well check. Dental exams once or twice a year. Routine eye exams. Ask your health care provider how often you should have your eyes checked. Personal lifestyle choices, including: Daily care of your teeth and gums. Regular physical activity. Eating a healthy diet. Avoiding tobacco and drug use. Limiting alcohol use. Practicing safe sex. Taking low doses of aspirin every day. Taking vitamin and mineral supplements as recommended by your health care provider. What happens during an annual well check? The services and screenings done by your health care provider during your annual well check will depend on your age, overall health, lifestyle risk factors, and family history of disease. Counseling  Your health care provider may ask you questions about your: Alcohol use. Tobacco use. Drug use. Emotional well-being. Home and relationship well-being. Sexual activity. Eating  habits. History of falls. Memory and ability to understand (cognition). Work and work Statistician. Screening  You may have the following tests or measurements: Height, weight, and BMI. Blood pressure. Lipid and cholesterol levels. These may be checked every 5 years, or more frequently if you are over 77 years old. or more frequently if you are over 33 years old. Skin check. Lung cancer screening. You may have this screening every year starting at age 77 if you have a 30-pack-year history of smoking and currently smoke or have quit within the past 15 years. 30-pack-year history of smoking and currently smoke or have quit within the past 15 years. Fecal occult blood test (FOBT) of the stool. You may have this test every year starting at age 77. Flexible sigmoidoscopy or colonoscopy. You may have a sigmoidoscopy every 5 years or a colonoscopy every 10 years starting at age 77. Prostate cancer screening. Recommendations will vary depending on your family history and other risks. Hepatitis C blood test. Hepatitis B blood test. Sexually transmitted disease (STD) testing. Diabetes screening. This is done by checking your blood sugar (glucose) after you have not eaten for a while (fasting). You may have this done every 1-3 years. Abdominal aortic aneurysm (AAA) screening. You may need this if you are a current or former smoker. Osteoporosis. You may be screened starting at age 77 if you are at high risk. Talk with your health care provider about your test results, treatment options, and if necessary, the need for more tests. Vaccines  Your health care provider may recommend certain vaccines, such as: Influenza vaccine. This is recommended every year. Tetanus, diphtheria, and acellular pertussis (Tdap, Td) vaccine. You may need a Td booster every 10 years. Zoster vaccine. You may need this after age 77. Pneumococcal 13-valent conjugate (PCV13) vaccine. One dose is recommended after age 77. Pneumococcal polysaccharide (PPSV23) vaccine. One dose is recommended after age 77. Talk to your health care  provider about which screenings and  vaccines you need and how often you need them. This information is not intended to replace advice given to you by your health care provider. Make sure you discuss any questions you have with your health care provider. Document Released: 07/21/2015 Document Revised: 03/13/2016 Document Reviewed: 04/25/2015 Elsevier Interactive Patient Education  2017 [place] Prevention in the Home Falls can cause injuries. They can happen to people of all ages. There are many things you can do to make your home safe and to help prevent falls. What can I do on the outside of my home? Regularly fix the edges of walkways and driveways and fix any cracks. Remove anything that might make you trip as you walk through a door, such as a raised step or threshold. Trim any bushes or trees on the path to your home. Use bright outdoor lighting. Clear any walking paths of anything that might make someone trip, such as rocks or tools. Regularly check to see if handrails are loose or broken. Make sure that both sides of any steps have handrails. Any raised decks and porches should have guardrails on the edges. Have any leaves, snow, or ice cleared regularly. Use sand or salt on walking paths during winter. Clean up any spills in your garage right away. This includes oil or grease spills. What can I do in the bathroom? Use night lights. Install grab bars by the toilet and in the tub and shower. Do not use towel bars as grab bars. Use non-skid mats or decals in the tub or shower. If you need to sit down in the shower, use a plastic, non-slip stool. Keep the floor dry. Clean up any water that spills on the floor as soon as it happens. Remove soap buildup in the tub or shower regularly. Attach bath mats securely with double-sided non-slip rug tape. Do not have throw rugs and other things on the floor that can make you trip. What can I do in the bedroom? Use night lights. Make sure that you have a light by your  bed that is easy to reach. Do not use any sheets or blankets that are too big for your bed. They should not hang down onto the floor. Have a firm chair that has side arms. You can use this for support while you get dressed. Do not have throw rugs and other things on the floor that can make you trip. What can I do in the kitchen? Clean up any spills right away. Avoid walking on wet floors. Keep items that you use a lot in easy-to-reach places. If you need to reach something above you, use a strong step stool that has a grab bar. Keep electrical cords out of the way. Do not use floor polish or wax that makes floors slippery. If you must use wax, use non-skid floor wax. Do not have throw rugs and other things on the floor that can make you trip. What can I do with my stairs? Do not leave any items on the stairs. Make sure that there are handrails on both sides of the stairs and use them. Fix handrails that are broken or loose. Make sure that handrails are as long as the stairways. Check any carpeting to make sure that it is firmly attached to the stairs. Fix any carpet that is loose or worn. Avoid having throw rugs at the top or bottom of the stairs. If you do have throw rugs, attach them to the  floor with carpet tape. Make sure that you have a light switch at the top of the stairs and the bottom of the stairs. If you do not have them, ask someone to add them for you. What else can I do to help prevent falls? Wear shoes that: Do not have high heels. Have rubber bottoms. Are comfortable and fit you well. Are closed at the toe. Do not wear sandals. If you use a stepladder: Make sure that it is fully opened. Do not climb a closed stepladder. Make sure that both sides of the stepladder are locked into place. Ask someone to hold it for you, if possible. Clearly mark and make sure that you can see: Any grab bars or handrails. First and last steps. Where the edge of each step is. Use tools that  help you move around (mobility aids) if they are needed. These include: Canes. Walkers. Scooters. Crutches. Turn on the lights when you go into a dark area. Replace any light bulbs as soon as they burn out. Set up your furniture so you have a clear path. Avoid moving your furniture around. If any of your floors are uneven, fix them. If there are any pets around you, be aware of where they are. Review your medicines with your doctor. Some medicines can make you feel dizzy. This can increase your chance of falling. Ask your doctor what other things that you can do to help prevent falls. This information is not intended to replace advice given to you by your health care provider. Make sure you discuss any questions you have with your health care provider. Document Released: 04/20/2009 Document Revised: 11/30/2015 Document Reviewed: 07/29/2014 Elsevier Interactive Patient Education  2017 [employer].

## 2021-12-27 NOTE — Progress Notes (Signed)
Virtual Visit via Telephone Note  I connected with  Juan Mack on 12/27/21 at  9:15 AM EDT by telephone and verified that I am speaking with the correct person using two identifiers.  Location: Patient: home Provider: [facility] Persons participating in the virtual visit: [place]   I discussed the limitations, risks, security and privacy concerns of performing an evaluation and management service by telephone and the availability of in person appointments. The patient expressed understanding and agreed to proceed.  Interactive audio and video telecommunications were attempted between this nurse and patient, however failed, due to patient having technical difficulties OR patient did not have access to video capability.  We continued and completed visit with audio only.  Some vital signs may be absent or patient reported.   Dionisio David, LPN  Subjective:   Juan Mack is a 76 y.o. male who presents for Medicare Annual/Subsequent preventive examination.  Review of Systems           Objective:    There were no vitals filed for this visit. There is no height or weight on file to calculate BMI.     03/30/2020    2:06 PM 12/27/2019    8:00 AM 03/29/2019    2:09 PM 03/26/2018    8:45 AM 10/24/2016    1:07 PM 05/20/2016   10:09 AM 03/21/2016    9:39 AM  Advanced Directives  Does Patient Have a Medical Advance Directive? No Yes _0   Type of Advance Directive  Living will;Healthcare Power of Attorney       Does patient want to make changes to medical advance directive?  No - Patient declined       Copy of [place] in Chart?  No - copy requested       Would patient like information on creating a medical advance directive? No - Patient declined  No - Patient declined No - Patient declined No - Patient declined  No - patient declined information    Current Medications (verified) Outpatient Encounter Medications as of 12/27/2021   Medication Sig   amLODipine (NORVASC) 10 MG tablet TAKE 1 TABLET(10 MG) BY MOUTH DAILY   aspirin 81 MG EC tablet Take 81 mg by mouth daily. Swallow whole.   atorvastatin (LIPITOR) 20 MG tablet TAKE 1 TABLET(20 MG) BY MOUTH DAILY   azelastine (ASTELIN) 0.1 % nasal spray Place 1 spray into both nostrils 2 (two) times daily. Use in each nostril as directed   benazepril (LOTENSIN) 40 MG tablet TAKE 1 TABLET BY MOUTH EVERY DAY   blood glucose meter kit and supplies Dispense based on patient and insurance preference (One Touch Ultra 2). Use once daily as directed. (FOR ICD-10 E11.9).   glucose blood test strip Check Blood Sugars Once Daily   hydrochlorothiazide (HYDRODIURIL) 25 MG tablet Take 1 tablet (25 mg total) by mouth daily.   Lancets MISC Check Blood Sugars Once Daily   latanoprost (XALATAN) 0.005 % ophthalmic solution Place 1 drop into both eyes at bedtime.   metFORMIN (GLUCOPHAGE) 1000 MG tablet TAKE 1 TABLET(1000 MG) BY MOUTH TWICE DAILY   pioglitazone (ACTOS) 45 MG tablet TAKE 1 TABLET(45 MG) BY MOUTH DAILY   ramelteon (ROZEREM) 8 MG tablet Take 1/2 a tablet up to a full tablet daily at bedtime. Start with 1/2 tablet.   sertraline (ZOLOFT) 50 MG tablet Take 1 tablet (50 mg total) by mouth daily.   traZODone (DESYREL) 50 MG tablet  Take 1 tablet (50 mg total) by mouth at bedtime.   No facility-administered encounter medications on file as of 12/27/2021.    Allergies (verified) Patient has no known allergies.   History: Past Medical History:  Diagnosis Date   Anxiety    Anxiety disorder    BCC (basal cell carcinoma)    Central vein occlusion of retina    Dermatitis    Diabetes mellitus, type II (HCC)    Elevated PSA    Hyperlipemia    Hyperlipidemia    Hypertension    Insomnia    Insomnia    OSA on CPAP    not using CPAP   Prostate nodule    Pruritus    Stable central retinal vein occlusion    Past Surgical History:  Procedure Laterality Date   CATARACT EXTRACTION  W/PHACO Left 12/27/2019   Procedure: CATARACT EXTRACTION PHACO AND INTRAOCULAR LENS PLACEMENT (IOC) LEFT DIABETIC 2.58  00:23.8;  Surgeon: Eulogio Bear, MD;  Location: [facility];  Service: Ophthalmology;  Laterality: Left;   COLONOSCOPY WITH PROPOFOL N/A 03/21/2016   Procedure: COLONOSCOPY WITH PROPOFOL;  Surgeon: Manya Silvas, MD;  Location[facility] ENDOSCOPY;  Service: Endoscopy;  Laterality: N/A;   COLONOSCOPY WITH PROPOFOL N/A 11/13/2016   Procedure: COLONOSCOPY WITH PROPOFOL;  Surgeon: Manya Silvas, MD;  Location: [facility] ENDOSCOPY;  Service: Endoscopy;  Laterality: N/A;   NO PAST SURGERIES     TOOTH EXTRACTION     Family History  Problem Relation Age of Onset   Prostate cancer Brother        Father   Diabetes Brother    Colon cancer Brother    Pancreatic cancer Sister    Lung cancer Sister    Coronary artery disease Father    Heart disease Father    Congestive Heart Failure Mother    Diabetes Mother    Social History   Socioeconomic History   Marital status: Married    Spouse name: Not on file   Number of children: 1   Years of education: Not on file   Highest education level: High school graduate  Occupational History   Occupation: retired  Tobacco Use   Smoking status: Former    Years: 14.00    Types: Cigarettes    Quit date: 07/08/1973    Years since quitting: 48.5   Smokeless tobacco: Never  Vaping Use   Vaping Use: Never used  Substance and Sexual Activity   Alcohol use: No    Alcohol/week: 0.0 standard drinks of alcohol   Drug use: No   Sexual activity: Not on file  Other Topics Concern   Not on file  Social History Narrative   Not on file   Social Determinants of Health   Financial Resource Strain: Low Risk  (03/30/2020)   Overall Financial Resource Strain (CARDIA)    Difficulty of Paying Living Expenses: Not hard at all  Food Insecurity: No Food Insecurity (03/30/2020)   Hunger Vital Sign    Worried About Running Out of Food in the  Last Year: Never true    Ran Out of Food in the Last Year: Never true  Transportation Needs: No Transportation Needs (03/30/2020)   PRAPARE - Hydrologist (Medical): No    Lack of Transportation (Non-Medical): No  Physical Activity: Inactive (12/27/2021)   Exercise Vital Sign    Days of Exercise per Week: 0 days    Minutes of Exercise per Session: 0 min  Stress:  No Stress Concern Present (12/27/2021)   [place]    Feeling of Stress : Not at all  Social Connections: Moderately Isolated (03/30/2020)   Social Connection and Isolation Panel [NHANES]    Frequency of Communication with Friends and Family: More than three times a week    Frequency of Social Gatherings with Friends and Family: Twice a week    Attends Religious Services: Never    Marine scientist or Organizations: No    Attends Music therapist: Never    Marital Status: Married    Tobacco Counseling Counseling given: Not Answered   Clinical Intake:  Pre-visit preparation completed: Yes  Pain : No/denies pain     Nutritional Risks: None Diabetes: Yes CBG done?: No Did pt. bring in CBG monitor from home?: No  How often do you need to have someone help you when you read instructions, pamphlets, or other written materials from your doctor or pharmacy?: 1 - Never  Diabetic?yes Nutrition Risk Assessment:  Has the patient had any N/V/D within the last 2 months?  No  Does the patient have any non-healing wounds?  No  Has the patient had any unintentional weight loss or weight gain?  No   Diabetes:  Is the patient diabetic?  Yes  If diabetic, was a CBG obtained today?  No  Did the patient bring in their glucometer from home?  No  How often do you monitor your CBG's? Couple times per week   Financial Strains and Diabetes Management:  Are you having any financial strains with the device, your supplies  or your medication? No .  Does the patient want to be seen by Chronic Care Management for management of their diabetes?  No  Would the patient like to be referred to a Nutritionist or for Diabetic Management?  No   Diabetic Exams:  Diabetic Eye Exam: Completed 10/16/21.  Pt has been advised about the importance in completing this exam.  Diabetic Foot Exam: Completed 09/29/19. Pt has been advised about the importance in completing this exam.   Interpreter Needed?: No  Information entered by :: Kirke Shaggy, LPN   Activities of Daily Living    08/31/2021   10:56 AM  In your present state of health, do you have any difficulty performing the following activities:  Hearing? 1  Vision? 0  Comment lens implant left eye  Difficulty concentrating or making decisions? 0  Walking or climbing stairs? 0  Dressing or bathing? 0  Doing errands, shopping? 0    Patient Care Team: Jerrol Banana., MD as PCP - General (Family Medicine) Hollice Espy, MD as Consulting Physician (Urology) Eulogio Bear, MD as Consulting Physician (Ophthalmology)  Indicate any recent Medical Services you may have received from other than [facility] providers in the past year (date may be approximate).     Assessment:   This is a routine wellness examination for [place].  Hearing/Vision screen No results found.  Dietary issues and exercise activities discussed:     Goals Addressed             This Visit's Progress    DIET - EAT MORE FRUITS AND VEGETABLES         Depression Screen    12/27/2021    9:14 AM 08/31/2021   10:55 AM 03/30/2020    2:03 PM 03/29/2019    2:10 PM 08/27/2018   10:37 AM 03/26/2018    8:47 AM  03/26/2018    8:45 AM  PHQ 2/9 Scores  PHQ - 2 Score 0 6 0 0 0 0 0  PHQ- 9 Score 0 23    0     Fall Risk    08/31/2021   10:55 AM 03/30/2020    2:06 PM 03/30/2019   10:16 AM 03/29/2019    2:10 PM 08/27/2018   10:37 AM  Fall Risk   Falls in the past year? 0 0 0 0 0  Number  falls in past yr: 0 0 0    Injury with Fall? 0 0 0    Risk for fall due to : No Fall Risks      Follow up   Falls evaluation completed      FALL RISK PREVENTION PERTAINING TO THE HOME:  Any stairs in or around the home? Yes  If so, are there any without handrails? No  Home free of loose throw rugs in walkways, pet beds, electrical cords, etc? Yes  Adequate lighting in your home to reduce risk of falls? Yes   ASSISTIVE DEVICES UTILIZED TO PREVENT FALLS:  Life alert? No  Use of a cane, walker or w/c? No  Grab bars in the bathroom? No  Shower chair or bench in shower? Yes  Elevated toilet seat or a handicapped toilet? No     Cognitive Function:        08/31/2021   11:44 AM 10/24/2016    1:10 PM  6CIT Screen  What Year? 0 points 0 points  What month? 0 points 0 points  What time? 0 points 0 points  Count back from 20 0 points 0 points  Months in reverse 0 points 0 points  Repeat phrase 2 points 2 points  Total Score 2 points 2 points    Immunizations Immunization History  Administered Date(s) Administered   Fluad Quad(high Dose 65+) 03/30/2019, 04/05/2020, 05/09/2021   Influenza Split 05/21/2011   Influenza, High Dose Seasonal PF 04/25/2014, 03/26/2018   Influenza,inj,Quad PF,6+ Mos 04/07/2013   Influenza-Unspecified 04/15/2017   PFIZER(Purple Top)SARS-COV-2 Vaccination 08/25/2019, 09/15/2019, 06/22/2020   Pneumococcal Conjugate-13 12/14/2013   Pneumococcal Polysaccharide-23 02/28/2011   Td 07/27/2003, 02/13/2015    TDAP status: Up to date  Flu Vaccine status: Up to date  Pneumococcal vaccine status: Up to date  Covid-19 vaccine status: Completed vaccines  Qualifies for Shingles Vaccine? Yes   Zostavax completed No   Shingrix Completed?: No.    Education has been provided regarding the importance of this vaccine. Patient has been advised to call insurance company to determine out of pocket expense if they have not yet received this vaccine. Advised may also  receive vaccine at local pharmacy or Health Dept. Verbalized acceptance and understanding.  Screening Tests Health Maintenance  Topic Date Due   Zoster Vaccines- Shingrix (1 of 2) Never done   COVID-19 Vaccine (4 - Booster for Pfizer series) 08/17/2020   FOOT EXAM  09/28/2020   COLONOSCOPY (Pts 45-47yr Insurance coverage will need to be confirmed)  11/13/2021   INFLUENZA VACCINE  02/05/2022   HEMOGLOBIN A1C  04/05/2022   OPHTHALMOLOGY EXAM  10/17/2022   TETANUS/TDAP  02/12/2025   Pneumonia Vaccine 76 Years old  Completed   Hepatitis C Screening  Completed   HPV VACCINES  Aged Out    Health Maintenance  Health Maintenance Due  Topic Date Due   Zoster Vaccines- Shingrix (1 of 2) Never done   COVID-19 Vaccine (4 - Booster for [employer]) 08/17/2020  FOOT EXAM  09/28/2020   COLONOSCOPY (Pts 45-36yr Insurance coverage will need to be confirmed)  11/13/2021    Colorectal cancer screening: Type of screening: Colonoscopy. Completed 11/13/16. Repeat every 5 years- declined referral  Lung Cancer Screening: (Low Dose CT Chest recommended if Age 77-80years, 30 pack-year currently smoking OR have quit w/in 15years.) does not qualify.   Additional Screening:  Hepatitis C Screening: does qualify; Completed 09/24/13  Vision Screening: Recommended annual ophthalmology exams for early detection of glaucoma and other disorders of the eye. Is the patient up to date with their annual eye exam?  Yes  Who is the provider or what is the name of the [facility] which the patient attends annual [facility] exams[facility] is not established with a provider, would they like to be referred to a provider to establish care? No .   Dental Screening: Recommended annual dental exams for proper oral hygiene  Community Resource Referral / Chronic Care Management: CRR required this visit?  No   CCM required this visit?  No      Plan:     I have personally reviewed and noted the following  in the patient's chart:   Medical and social history Use of alcohol, tobacco or illicit drugs  Current medications and supplements including opioid prescriptions. Patient is not currently taking opioid prescriptions. Functional ability and status Nutritional status Physical activity Advanced directives List of other physicians Hospitalizations, surgeries, and ER visits in previous 12 months Vitals Screenings to include cognitive, depression, and falls Referrals and appointments  In addition, I have reviewed and discussed with patient certain preventive protocols, quality metrics, and best practice recommendations. A written personalized care plan for preventive services as well as general preventive health recommendations were provided to patient.     LDionisio David LPN   [id number]  Nurse Notes: none

## 2022-01-28 ENCOUNTER — Other Ambulatory Visit: Payer: Self-pay | Admitting: Family Medicine

## 2022-01-28 DIAGNOSIS — I10 Essential (primary) hypertension: Secondary | ICD-10-CM

## 2022-02-04 ENCOUNTER — Other Ambulatory Visit: Payer: Self-pay | Admitting: Physician Assistant

## 2022-02-04 DIAGNOSIS — F411 Generalized anxiety disorder: Secondary | ICD-10-CM

## 2022-02-04 DIAGNOSIS — F5105 Insomnia due to other mental disorder: Secondary | ICD-10-CM

## 2022-02-18 ENCOUNTER — Ambulatory Visit (INDEPENDENT_AMBULATORY_CARE_PROVIDER_SITE_OTHER): Payer: PPO | Admitting: Family Medicine

## 2022-02-18 ENCOUNTER — Encounter: Payer: Self-pay | Admitting: Family Medicine

## 2022-02-18 VITALS — BP 116/58 | HR 69 | Resp 16 | Ht 71.0 in | Wt 182.0 lb

## 2022-02-18 DIAGNOSIS — G4733 Obstructive sleep apnea (adult) (pediatric): Secondary | ICD-10-CM

## 2022-02-18 DIAGNOSIS — E7800 Pure hypercholesterolemia, unspecified: Secondary | ICD-10-CM

## 2022-02-18 DIAGNOSIS — Z0000 Encounter for general adult medical examination without abnormal findings: Secondary | ICD-10-CM

## 2022-02-18 DIAGNOSIS — E119 Type 2 diabetes mellitus without complications: Secondary | ICD-10-CM

## 2022-02-18 DIAGNOSIS — Z1211 Encounter for screening for malignant neoplasm of colon: Secondary | ICD-10-CM | POA: Diagnosis not present

## 2022-02-18 DIAGNOSIS — D649 Anemia, unspecified: Secondary | ICD-10-CM

## 2022-02-18 DIAGNOSIS — I10 Essential (primary) hypertension: Secondary | ICD-10-CM

## 2022-02-18 LAB — IFOBT (OCCULT BLOOD): IFOBT: NEGATIVE

## 2022-02-18 NOTE — Progress Notes (Unsigned)
Complete physical exam  I,April Miller,acting as a scribe for Wilhemena Durie, MD.,have documented all relevant documentation on the behalf of Wilhemena Durie, MD,as directed by  Wilhemena Durie, MD while in the presence of Wilhemena Durie, MD.    Patient: Juan Mack   DOB: 10/01/1945   76 y.o. Male  MRN: [id number] Visit Date: 02/18/2022  Today's healthcare provider: Wilhemena Durie, MD   Chief Complaint  Patient presents with   Annual Exam   Subjective    Juan Mack is a 76 y.o. male who presents today for a complete physical exam.  He reports consuming a general diet.  Juan Mack work  He generally feels well. He reports sleeping fairly well. He does not have additional problems to discuss today.  HPI  Patient had AWV with [facility] on 12/27/2021. NOTE Patient is due for A1C, Diabetic foot exam, Urine Micro-albumin and colonoscopy  Past Medical History:  Diagnosis Date   Anxiety    Anxiety disorder    BCC (basal cell carcinoma)    Central vein occlusion of retina    Dermatitis    Diabetes mellitus, type II (HCC)    Elevated PSA    Hyperlipemia    Hyperlipidemia    Hypertension    Insomnia    Insomnia    OSA on CPAP    not using CPAP   Prostate nodule    Pruritus    Stable central retinal vein occlusion    Past Surgical History:  Procedure Laterality Date   CATARACT EXTRACTION W/PHACO Left 12/27/2019   Procedure: CATARACT EXTRACTION PHACO AND INTRAOCULAR LENS PLACEMENT (IOC) LEFT DIABETIC 2.58  00:23.8;  Surgeon: Eulogio Bear, MD;  Location: [facility];  Service: Ophthalmology;  Laterality: Left;   COLONOSCOPY WITH PROPOFOL N/A 03/21/2016   Procedure: COLONOSCOPY WITH PROPOFOL;  Surgeon: Manya Silvas, MD;  Location: [facility] ENDOSCOPY;  Service: Endoscopy;  Laterality: N/A;   COLONOSCOPY WITH PROPOFOL N/A 11/13/2016   Procedure: COLONOSCOPY WITH PROPOFOL;  Surgeon: Manya Silvas, MD;  Location: [facility] ENDOSCOPY;  Service: Endoscopy;   Laterality: N/A;   NO PAST SURGERIES     TOOTH EXTRACTION     Social History   Socioeconomic History   Marital status: Married    Spouse name: Not on file   Number of children: 1   Years of education: Not on file   Highest education level: High school graduate  Occupational History   Occupation: retired  Tobacco Use   Smoking status: Former    Years: 14.00    Types: Cigarettes    Quit date: 07/08/1973    Years since quitting: 48.6   Smokeless tobacco: Never  Vaping Use   Vaping Use: Never used  Substance and Sexual Activity   Alcohol use: No    Alcohol/week: 0.0 standard drinks of alcohol   Drug use: No   Sexual activity: Not on file  Other Topics Concern   Not on file  Social History Narrative   Not on file   Social Determinants of Health   Financial Resource Strain: Low Risk  (12/27/2021)   Overall Financial Resource Strain (CARDIA)    Difficulty of Paying Living Expenses: Not hard at all  Food Insecurity: No Food Insecurity (12/27/2021)   Hunger Vital Sign    Worried About Running Out of Food in the Last Year: Never true    [place] in the Last Year: Never true  Transportation Needs: No  Transportation Needs (12/27/2021)   PRAPARE - Hydrologist (Medical): No    Lack of Transportation (Non-Medical): No  Physical Activity: Inactive (12/27/2021)   Exercise Vital Sign    Days of Exercise per Week: 0 days    Minutes of Exercise per Session: 0 min  Stress: No Stress Concern Present (12/27/2021)   [place]    Feeling of Stress : Not at all  Social Connections: Moderately Integrated (12/27/2021)   Social Connection and Isolation Panel [NHANES]    Frequency of Communication with Friends and Family: More than three times a week    Frequency of Social Gatherings with Friends and Family: Three times a week    Attends Religious Services: More than 4 times per year    Active  Member of Clubs or Organizations: No    Attends Archivist Meetings: Never    Marital Status: Married  Human resources officer Violence: Not At Risk (12/27/2021)   Humiliation, Afraid, Rape, and Kick questionnaire    Fear of Current or Ex-Partner: No    Emotionally Abused: No    Physically Abused: No    Sexually Abused: No   Family Status  Relation Name Status   Brother  Alive   Brother  Deceased at age 17   Sister  Deceased       in her 56s   Sister  Deceased at age 24   Father  Deceased at age 60   Mother  Deceased at age 2   Family History  Problem Relation Age of Onset   Prostate cancer Brother        Father   Diabetes Brother    Colon cancer Brother    Pancreatic cancer Sister    Lung cancer Sister    Coronary artery disease Father    Heart disease Father    Congestive Heart Failure Mother    Diabetes Mother    No Known Allergies  Patient Care Team: Jerrol Banana., MD as PCP - General (Family Medicine) Hollice Espy, MD as Consulting Physician (Urology) Eulogio Bear, MD as Consulting Physician (Ophthalmology)   Medications: Outpatient Medications Prior to Visit  Medication Sig   amLODipine (NORVASC) 10 MG tablet TAKE 1 TABLET(10 MG) BY MOUTH DAILY   aspirin 81 MG EC tablet Take 81 mg by mouth daily. Swallow whole.   atorvastatin (LIPITOR) 20 MG tablet TAKE 1 TABLET(20 MG) BY MOUTH DAILY   benazepril (LOTENSIN) 40 MG tablet TAKE 1 TABLET BY MOUTH EVERY DAY   glucose blood test strip Check Blood Sugars Once Daily   hydrochlorothiazide (HYDRODIURIL) 25 MG tablet TAKE 1 TABLET(25 MG) BY MOUTH DAILY   Lancets MISC Check Blood Sugars Once Daily   latanoprost (XALATAN) 0.005 % ophthalmic solution Place 1 drop into both eyes at bedtime.   metFORMIN (GLUCOPHAGE) 1000 MG tablet TAKE 1 TABLET(1000 MG) BY MOUTH TWICE DAILY   pioglitazone (ACTOS) 45 MG tablet TAKE 1 TABLET(45 MG) BY MOUTH DAILY   sertraline (ZOLOFT) 50 MG tablet TAKE 1 TABLET(50 MG) BY  MOUTH DAILY   [DISCONTINUED] azelastine (ASTELIN) 0.1 % nasal spray Place 1 spray into both nostrils 2 (two) times daily. Use in each nostril as directed (Patient not taking: Reported on 02/18/2022)   [DISCONTINUED] blood glucose meter kit and supplies Dispense based on patient and insurance preference (One Touch Ultra 2). Use once daily as directed. (FOR ICD-10 E11.9).   [DISCONTINUED] ramelteon (ROZEREM) 8  MG tablet Take 1/2 a tablet up to a full tablet daily at bedtime. Start with 1/2 tablet. (Patient not taking: Reported on 02/18/2022)   [DISCONTINUED] traZODone (DESYREL) 50 MG tablet Take 1 tablet (50 mg total) by mouth at bedtime. (Patient not taking: Reported on 02/18/2022)   No facility-administered medications prior to visit.    Review of Systems  All other systems reviewed and are negative.   {Labs  Heme  Chem  Endocrine  Serology  Results Review (optional):23779}  Objective     BP (!) 116/58 (BP Location: Right Arm, Patient Position: Sitting, Cuff Size: Large)   Pulse 69   Resp 16   Ht _0  (1.803 m)   Wt 182 lb (82.6 kg)   SpO2 98%   BMI 25.38 kg/m  {Show previous vital signs (optional):23777}   Physical Exam Constitutional:      Appearance: Normal appearance. He is normal weight.  HENT:     Head: Normocephalic and atraumatic.     Right Ear: Tympanic membrane, ear canal and external ear normal.     Left Ear: Tympanic membrane, ear canal and external ear normal.     Nose: Nose normal.     Mouth/Throat:     Mouth: Mucous membranes are moist.     Pharynx: Oropharynx is clear.  Eyes:     Extraocular Movements: Extraocular movements intact.     Conjunctiva/sclera: Conjunctivae normal.     Pupils: Pupils are equal, round, and reactive to light.  Cardiovascular:     Rate and Rhythm: Normal rate and regular rhythm.     Pulses: Normal pulses.     Heart sounds: Normal heart sounds.  Pulmonary:     Effort: Pulmonary effort is normal.     Breath sounds: Normal  breath sounds.  Abdominal:     General: Abdomen is flat. Bowel sounds are normal.     Palpations: Abdomen is soft.  Musculoskeletal:        General: Normal range of motion.     Cervical back: Normal range of motion and neck supple.  Skin:    General: Skin is warm and dry.  Neurological:     General: No focal deficit present.     Mental Status: He is alert and oriented to person, place, and time. Mental status is at baseline.  Psychiatric:        Mood and Affect: Mood normal.        Behavior: Behavior normal.        Thought Content: Thought content normal.        Judgment: Judgment normal.     ***  Last depression screening scores    12/27/2021    9:14 AM 08/31/2021   10:55 AM 03/30/2020    2:03 PM  PHQ 2/9 Scores  PHQ - 2 Score 0 6 0  PHQ- 9 Score 0 23    Last fall risk screening    12/27/2021    9:16 AM  [place] in the past year? 0  Number falls in past yr: 0  Injury with Fall? 0  Risk for fall due to : No Fall Risks  Follow up Falls evaluation completed   Last Audit-C alcohol use screening    12/27/2021    9:13 AM  Alcohol Use Disorder Test (AUDIT)  1. How often do you have a drink containing alcohol? 0  2. How many drinks containing alcohol do you have on a typical day when you are drinking?  0  3. How often do you have six or more drinks on one occasion? 0  AUDIT-C Score 0   A score of 3 or more in women, and 4 or more in men indicates increased risk for alcohol abuse, EXCEPT if all of the points are from question 1   Results for orders placed or performed in visit on 02/18/22  HM DIABETES EYE EXAM  Result Value Ref Range   HM Diabetic Eye Exam No Retinopathy No Retinopathy    Assessment & Plan    Routine Health Maintenance and Physical Exam  Exercise Activities and Dietary recommendations  Goals      DIET - EAT MORE FRUITS AND VEGETABLES     Increase water intake     Recommend increasing water intake to 4 glasses a day.         Immunization History  Administered Date(s) Administered   Fluad Quad(high Dose 65+) 03/30/2019, 04/05/2020, 05/09/2021   Influenza Split 05/21/2011   Influenza, High Dose Seasonal PF 04/25/2014, 03/26/2018   Influenza,inj,Quad PF,6+ Mos 04/07/2013   Influenza-Unspecified 04/15/2017   PFIZER(Purple Top)SARS-COV-2 Vaccination 08/25/2019, 09/15/2019, 06/22/2020   Pneumococcal Conjugate-13 12/14/2013   Pneumococcal Polysaccharide-23 02/28/2011   Td 07/27/2003, 02/13/2015    Health Maintenance  Topic Date Due   Diabetic kidney evaluation - Urine ACR  Never done   Zoster Vaccines- Shingrix (1 of 2) Never done   COVID-19 Vaccine (4 - Pfizer risk series) 08/17/2020   FOOT EXAM  09/28/2020   COLONOSCOPY (Pts 45-53yr Insurance coverage will need to be confirmed)  11/13/2021   INFLUENZA VACCINE  02/05/2022   HEMOGLOBIN A1C  04/05/2022   Diabetic kidney evaluation - GFR measurement  10/04/2022   OPHTHALMOLOGY EXAM  10/17/2022   TETANUS/TDAP  02/12/2025   Pneumonia Vaccine 76 Years old  Completed   Hepatitis C Screening  Completed   HPV VACCINES  Aged Out    Discussed health benefits of physical activity, and encouraged him to engage in regular exercise appropriate for his age and condition.  1. Annual physical exam   2. Type 2 diabetes mellitus without complication, without long-term current use of insulin (HCC)  - Hemoglobin A1c - CBC w/Diff/Platelet - Renal function panel - Iron, TIBC and Ferritin Panel  3. Essential (primary) hypertension  - Hemoglobin A1c - CBC w/Diff/Platelet - Renal function panel - Iron, TIBC and Ferritin Panel  4. Pure hypercholesterolemia  - Hemoglobin A1c - CBC w/Diff/Platelet - Renal function panel - Iron, TIBC and Ferritin Panel  5. Anemia, unspecified type  - Hemoglobin A1c - CBC w/Diff/Platelet - Renal function panel - Iron, TIBC and Ferritin Panel  6. Obstructive apnea  - Hemoglobin A1c - CBC w/Diff/Platelet - Renal  function panel - Iron, TIBC and Ferritin Panel  7. Encounter for screening fecal occult blood testing  - IFOBT POC (occult bld, rslt in office)--Negative.    Return in about 6 months (around 08/21/2022).     {provider attestation***:1}   RWilhemena Durie MD  [phone](phone) [phone](fax)  [place]

## 2022-02-19 LAB — HEMOGLOBIN A1C
Est. average glucose Bld gHb Est-mCnc: 140 mg/dL
Hgb A1c MFr Bld: 6.5 % — ABNORMAL HIGH (ref 4.8–5.6)

## 2022-02-19 LAB — RENAL FUNCTION PANEL
Albumin: 4.4 g/dL (ref 3.8–4.8)
BUN/Creatinine Ratio: 18 (ref 10–24)
BUN: 28 mg/dL — ABNORMAL HIGH (ref 8–27)
CO2: 18 mmol/L — ABNORMAL LOW (ref 20–29)
Calcium: 9.3 mg/dL (ref 8.6–10.2)
Chloride: 105 mmol/L (ref 96–106)
Creatinine, Ser: 1.53 mg/dL — ABNORMAL HIGH (ref 0.76–1.27)
Glucose: 134 mg/dL — ABNORMAL HIGH (ref 70–99)
Phosphorus: 3.8 mg/dL (ref 2.8–4.1)
Potassium: 4.8 mmol/L (ref 3.5–5.2)
Sodium: 139 mmol/L (ref 134–144)
eGFR: 47 mL/min/{1.73_m2} — ABNORMAL LOW (ref >59)

## 2022-02-19 LAB — IRON,TIBC AND FERRITIN PANEL
Ferritin: 72 ng/mL (ref 30–400)
Iron Saturation: 29 % (ref 15–55)
Iron: 82 ug/dL (ref 38–169)
Total Iron Binding Capacity: 283 ug/dL (ref 250–450)
UIBC: 201 ug/dL (ref 111–343)

## 2022-02-19 LAB — CBC WITH DIFFERENTIAL/PLATELET
Basophils Absolute: 0.1 10*3/uL (ref 0.0–0.2)
Basos: 1 %
EOS (ABSOLUTE): 0.1 10*3/uL (ref 0.0–0.4)
Eos: 2 %
Hematocrit: 32 % — ABNORMAL LOW (ref 37.5–51.0)
Hemoglobin: 10.5 g/dL — ABNORMAL LOW (ref 13.0–17.7)
Immature Grans (Abs): 0 10*3/uL (ref 0.0–0.1)
Immature Granulocytes: 0 %
Lymphocytes Absolute: 2.2 10*3/uL (ref 0.7–3.1)
Lymphs: 24 %
MCH: 30.9 pg (ref 26.6–33.0)
MCHC: 32.8 g/dL (ref 31.5–35.7)
MCV: 94 fL (ref 79–97)
Monocytes Absolute: 0.9 10*3/uL (ref 0.1–0.9)
Monocytes: 10 %
Neutrophils Absolute: 5.6 10*3/uL (ref 1.4–7.0)
Neutrophils: 63 %
Platelets: 306 10*3/uL (ref 150–450)
RBC: 3.4 x10E6/uL — ABNORMAL LOW (ref 4.14–5.80)
RDW: 13.3 % (ref 11.6–15.4)
WBC: 8.9 10*3/uL (ref 3.4–10.8)

## 2022-02-22 ENCOUNTER — Other Ambulatory Visit: Payer: Self-pay | Admitting: *Deleted

## 2022-02-22 DIAGNOSIS — D649 Anemia, unspecified: Secondary | ICD-10-CM

## 2022-02-28 ENCOUNTER — Other Ambulatory Visit: Payer: Self-pay | Admitting: Family Medicine

## 2022-03-13 ENCOUNTER — Other Ambulatory Visit: Payer: PPO

## 2022-03-13 DIAGNOSIS — C61 Malignant neoplasm of prostate: Secondary | ICD-10-CM | POA: Diagnosis not present

## 2022-03-14 ENCOUNTER — Other Ambulatory Visit: Payer: PPO

## 2022-03-14 LAB — PSA: Prostate Specific Ag, Serum: 2.9 ng/mL (ref 0.0–4.0)

## 2022-03-19 ENCOUNTER — Ambulatory Visit: Payer: PPO | Admitting: Urology

## 2022-03-19 ENCOUNTER — Encounter: Payer: Self-pay | Admitting: Urology

## 2022-03-19 VITALS — BP 153/62 | HR 73 | Ht 71.0 in | Wt 182.0 lb

## 2022-03-19 DIAGNOSIS — Z08 Encounter for follow-up examination after completed treatment for malignant neoplasm: Secondary | ICD-10-CM | POA: Diagnosis not present

## 2022-03-19 DIAGNOSIS — Z8546 Personal history of malignant neoplasm of prostate: Secondary | ICD-10-CM

## 2022-03-19 DIAGNOSIS — R6889 Other general symptoms and signs: Secondary | ICD-10-CM

## 2022-03-19 DIAGNOSIS — C61 Malignant neoplasm of prostate: Secondary | ICD-10-CM

## 2022-03-19 NOTE — Progress Notes (Signed)
03/19/2022 1:36 PM   Juan Mack [birth year] [id number]  Referring provider: Jerrol Banana., MD [address] [place],  [place] [address]  Chief Complaint  Patient presents with   Prostate Cancer    HPI: 76 year old male with a personal history of prostate cancer returns today for routine annual follow-up.  He was diagnosed in 11/2014 with low risk prostate cancer. Gleason score 3+3 in the right mid lateral 1/2 biopsies 3% with 4.0 PSA.    Endorectal MRI in 01/2016 was unremarkable.   Currently on active surveillance. PSA remained stable around 2-2.5 ng/dL.   Most recent PSA on 03/13/2022 was 2.9.  Notably, he does have known firm induration on the left.  PMH: Past Medical History:  Diagnosis Date   Anxiety    Anxiety disorder    BCC (basal cell carcinoma)    Central vein occlusion of retina    Dermatitis    Diabetes mellitus, type II (HCC)    Elevated PSA    Hyperlipemia    Hyperlipidemia    Hypertension    Insomnia    Insomnia    OSA on CPAP    not using CPAP   Prostate nodule    Pruritus    Stable central retinal vein occlusion     Surgical History: Past Surgical History:  Procedure Laterality Date   CATARACT EXTRACTION W/PHACO Left 12/27/2019   Procedure: CATARACT EXTRACTION PHACO AND INTRAOCULAR LENS PLACEMENT (IOC) LEFT DIABETIC 2.58  00:23.8;  Surgeon: Eulogio Bear, MD;  Location: [facility];  Service: Ophthalmology;  Laterality: Left;   COLONOSCOPY WITH PROPOFOL N/A 03/21/2016   Procedure: COLONOSCOPY WITH PROPOFOL;  Surgeon: Manya Silvas, MD;  Location: [facility] ENDOSCOPY;  Service: Endoscopy;  Laterality: N/A;   COLONOSCOPY WITH PROPOFOL N/A 11/13/2016   Procedure: COLONOSCOPY WITH PROPOFOL;  Surgeon: Manya Silvas, MD;  Location: [facility] ENDOSCOPY;  Service: Endoscopy;  Laterality: N/A;   NO PAST SURGERIES     TOOTH EXTRACTION      Home Medications:  Allergies as of 03/19/2022   No Known Allergies       Medication List        Accurate as of March 19, 2022  1:36 PM. If you have any questions, ask your nurse or doctor.          amLODipine 10 MG tablet Commonly known as: NORVASC TAKE 1 TABLET(10 MG) BY MOUTH DAILY   aspirin EC 81 MG tablet Take 81 mg by mouth daily. Swallow whole.   atorvastatin 20 MG tablet Commonly known as: LIPITOR TAKE 1 TABLET(20 MG) BY MOUTH DAILY   benazepril 40 MG tablet Commonly known as: LOTENSIN TAKE 1 TABLET BY MOUTH EVERY DAY   glucose blood test strip Check Blood Sugars Once Daily   hydrochlorothiazide 25 MG tablet Commonly known as: HYDRODIURIL TAKE 1 TABLET(25 MG) BY MOUTH DAILY   Lancets Misc Check Blood Sugars Once Daily   latanoprost 0.005 % ophthalmic solution Commonly known as: XALATAN Place 1 drop into both eyes at bedtime.   metFORMIN 1000 MG tablet Commonly known as: GLUCOPHAGE TAKE 1 TABLET(1000 MG) BY MOUTH TWICE DAILY   pioglitazone 45 MG tablet Commonly known as: ACTOS TAKE 1 TABLET(45 MG) BY MOUTH DAILY   sertraline 50 MG tablet Commonly known as: ZOLOFT TAKE 1 TABLET(50 MG) BY MOUTH DAILY        Allergies: No Known Allergies  Family History: Family History  Problem Relation Age of Onset   Prostate cancer  Brother        Father   Diabetes Brother    Colon cancer Brother    Pancreatic cancer Sister    Lung cancer Sister    Coronary artery disease Father    Heart disease Father    Congestive Heart Failure Mother    Diabetes Mother     Social History:  reports that he quit smoking about 48 years ago. His smoking use included cigarettes. He has never used smokeless tobacco. He reports that he does not drink alcohol and does not use drugs.   Physical Exam: BP (!) 153/62   Pulse 73   Ht '5\' 11"'$  (1.803 m)   Wt 182 lb (82.6 kg)   BMI 25.38 kg/m   Constitutional:  Alert and oriented, No acute distress.  Accompanied by his wife today. HEENT: [place] AT, moist mucus membranes.  Trachea midline, no  masses. Rectal: Normal sphincter tone.  50 cc prostate, stable firm induration on the left Neurologic: Grossly intact, no focal deficits, moving all 4 extremities. Psychiatric: Normal mood and affect.  Laboratory Data: Lab Results  Component Value Date   WBC 8.9 02/18/2022   HGB 10.5 (L) 02/18/2022   HCT 32.0 (L) 02/18/2022   MCV 94 02/18/2022   PLT 306 02/18/2022    Lab Results  Component Value Date   CREATININE 1.53 (H) 02/18/2022  \   Assessment & Plan:    1. Prostate cancer ([place]) Stable PSA and prostate exam which is very reassuring  Given his overall stability over the course of years, I do think it is reasonable to decrease the frequency of active surveillance to once per year.  Both he and his wife are agreeable this plan.  2. Abnormal digital rectal exam As above   Return in about 1 year (around 03/20/2023) for PSA prior , MD follow up.  Hollice Espy, MD  [facility] [address], [place], [place] [address] [phone]

## 2022-04-26 ENCOUNTER — Other Ambulatory Visit: Payer: Self-pay | Admitting: Family Medicine

## 2022-05-07 ENCOUNTER — Telehealth: Payer: Self-pay | Admitting: Family Medicine

## 2022-05-07 DIAGNOSIS — E119 Type 2 diabetes mellitus without complications: Secondary | ICD-10-CM

## 2022-05-07 MED ORDER — PIOGLITAZONE HCL 45 MG PO TABS: ORAL_TABLET | ORAL | 1 refills | Status: AC

## 2022-05-07 NOTE — Telephone Encounter (Signed)
[facility] requesting 90 day supply of  pioglitazone (ACTOS) 45 MG tablet [[id number]]

## 2022-05-08 DIAGNOSIS — H401131 Primary open-angle glaucoma, bilateral, mild stage: Secondary | ICD-10-CM | POA: Diagnosis not present

## 2022-05-14 DIAGNOSIS — H401131 Primary open-angle glaucoma, bilateral, mild stage: Secondary | ICD-10-CM | POA: Diagnosis not present

## 2022-07-29 ENCOUNTER — Other Ambulatory Visit: Payer: Self-pay | Admitting: Physician Assistant

## 2022-07-29 DIAGNOSIS — F5105 Insomnia due to other mental disorder: Secondary | ICD-10-CM

## 2022-07-29 DIAGNOSIS — F411 Generalized anxiety disorder: Secondary | ICD-10-CM

## 2022-08-21 ENCOUNTER — Ambulatory Visit: Payer: PPO | Admitting: Family Medicine

## 2022-08-29 DIAGNOSIS — E119 Type 2 diabetes mellitus without complications: Secondary | ICD-10-CM | POA: Diagnosis not present

## 2022-08-29 DIAGNOSIS — C61 Malignant neoplasm of prostate: Secondary | ICD-10-CM | POA: Diagnosis not present

## 2022-08-29 DIAGNOSIS — D508 Other iron deficiency anemias: Secondary | ICD-10-CM | POA: Diagnosis not present

## 2022-08-29 DIAGNOSIS — G4733 Obstructive sleep apnea (adult) (pediatric): Secondary | ICD-10-CM | POA: Diagnosis not present

## 2022-08-29 DIAGNOSIS — Z23 Encounter for immunization: Secondary | ICD-10-CM | POA: Diagnosis not present

## 2022-08-29 DIAGNOSIS — E782 Mixed hyperlipidemia: Secondary | ICD-10-CM | POA: Diagnosis not present

## 2022-08-29 DIAGNOSIS — I10 Essential (primary) hypertension: Secondary | ICD-10-CM | POA: Diagnosis not present

## 2022-10-23 DIAGNOSIS — Z86010 Personal history of colonic polyps: Secondary | ICD-10-CM | POA: Diagnosis not present

## 2022-10-23 DIAGNOSIS — Z01818 Encounter for other preprocedural examination: Secondary | ICD-10-CM | POA: Diagnosis not present

## 2022-10-29 ENCOUNTER — Other Ambulatory Visit: Payer: Self-pay | Admitting: Family Medicine

## 2022-10-29 DIAGNOSIS — E119 Type 2 diabetes mellitus without complications: Secondary | ICD-10-CM

## 2022-10-29 NOTE — Telephone Encounter (Signed)
Patient no longer at this office Requested Prescriptions  Pending Prescriptions Disp Refills   pioglitazone (ACTOS) 45 MG tablet [Pharmacy Med Name: PIOGLITAZONE  TABLETS] 90 tablet 1    Sig: TAKE 1 TABLET(45 MG) BY MOUTH DAILY     Endocrinology:  Diabetes - Glitazones - pioglitazone Failed - 10/29/2022  3:12 AM      Failed - HBA1C is between 0 and 7.9 and within 180 days    Hgb A1c MFr Bld  Date Value Ref Range Status  02/18/2022 6.5 (H) 4.8 - 5.6 % Final    Comment:             Prediabetes: 5.7 - 6.4          Diabetes: >6.4          Glycemic control for adults with diabetes: <7.0          Failed - Valid encounter within last 6 months    Recent Outpatient Visits           8 months ago Annual physical exam   [facility] Bosie Clos, MD   1 year ago Type 2 diabetes mellitus without complication, without long-term current use of insulin (HCC)   [facility] Bosie Clos, MD   1 year ago GAD (generalized anxiety disorder)   [place] [facility] Alfredia Ferguson, PA-C   1 year ago Type 2 diabetes mellitus without complication, without long-term current use of insulin [facility])   [place] [facility] Bosie Clos, MD   1 year ago Type 2 diabetes mellitus without complication, without long-term current use of insulin [facility])   [place] [facility] Bosie Clos, MD       Future Appointments             In 4 months Vanna Scotland, MD [facility] [place]

## 2022-12-05 ENCOUNTER — Other Ambulatory Visit: Payer: Self-pay | Admitting: Family Medicine

## 2022-12-05 DIAGNOSIS — E119 Type 2 diabetes mellitus without complications: Secondary | ICD-10-CM

## 2022-12-06 NOTE — Telephone Encounter (Signed)
Requested Prescriptions  Pending Prescriptions Disp Refills   pioglitazone (ACTOS) 45 MG tablet [Pharmacy Med Name: PIOGLITAZONE 45MG  TABLETS] 90 tablet 1    Sig: TAKE 1 TABLET(45 MG) BY MOUTH DAILY     Endocrinology:  Diabetes - Glitazones - pioglitazone Failed - 12/05/2022  7:03 PM      Failed - HBA1C is between 0 and 7.9 and within 180 days    Hgb A1c MFr Bld  Date Value Ref Range Status  02/18/2022 6.5 (H) 4.8 - 5.6 % Final    Comment:             Prediabetes: 5.7 - 6.4          Diabetes: >6.4          Glycemic control for adults with diabetes: <7.0          Failed - Valid encounter within last 6 months    Recent Outpatient Visits           9 months ago Annual physical exam   [facility] Bosie Clos, MD   1 year ago Type 2 diabetes mellitus without complication, without long-term current use of insulin (HCC)   [place] [facility] Bosie Clos, MD   1 year ago GAD (generalized anxiety disorder)   [place] [facility] Alfredia Ferguson, PA-C   1 year ago Type 2 diabetes mellitus without complication, without long-term current use of insulin [facility])   [place] [facility] Bosie Clos, MD   1 year ago Type 2 diabetes mellitus without complication, without long-term current use of insulin [facility])   [place] [facility] Bosie Clos, MD       Future Appointments             In 3 months Vanna Scotland, MD [facility] [place]

## 2023-01-01 DIAGNOSIS — E119 Type 2 diabetes mellitus without complications: Secondary | ICD-10-CM | POA: Diagnosis not present

## 2023-01-01 DIAGNOSIS — G4733 Obstructive sleep apnea (adult) (pediatric): Secondary | ICD-10-CM | POA: Diagnosis not present

## 2023-01-01 DIAGNOSIS — I10 Essential (primary) hypertension: Secondary | ICD-10-CM | POA: Diagnosis not present

## 2023-01-01 DIAGNOSIS — D508 Other iron deficiency anemias: Secondary | ICD-10-CM | POA: Diagnosis not present

## 2023-01-01 DIAGNOSIS — C61 Malignant neoplasm of prostate: Secondary | ICD-10-CM | POA: Diagnosis not present

## 2023-01-21 ENCOUNTER — Ambulatory Visit: Admit: 2023-01-21 | Payer: PPO

## 2023-01-21 SURGERY — COLONOSCOPY WITH PROPOFOL
Anesthesia: General

## 2023-01-26 ENCOUNTER — Other Ambulatory Visit: Payer: Self-pay | Admitting: Family Medicine

## 2023-01-26 DIAGNOSIS — F411 Generalized anxiety disorder: Secondary | ICD-10-CM

## 2023-01-26 DIAGNOSIS — F5105 Insomnia due to other mental disorder: Secondary | ICD-10-CM

## 2023-01-28 NOTE — Telephone Encounter (Signed)
Requested Prescriptions  Refused Prescriptions Disp Refills   sertraline (ZOLOFT) 50 MG tablet [Pharmacy Med Name: SERTRALINE 50MG  TABLETS] 90 tablet 1    Sig: TAKE 1 TABLET(50 MG) BY MOUTH DAILY     Psychiatry:  Antidepressants - SSRI - sertraline Failed - 01/26/2023 12:18 PM      Failed - AST in normal range and within 360 days    AST  Date Value Ref Range Status  10/03/2021 11 0 - 40 IU/L Final         Failed - ALT in normal range and within 360 days    ALT  Date Value Ref Range Status  10/03/2021 9 0 - 44 IU/L Final         Failed - Completed PHQ-2 or PHQ-9 in the last 360 days      Failed - Valid encounter within last 6 months    Recent Outpatient Visits           11 months ago Annual physical exam   [facility] Bosie Clos, MD   1 year ago Type 2 diabetes mellitus without complication, without long-term current use of insulin (HCC)   [facility] Bosie Clos, MD   1 year ago GAD (generalized anxiety disorder)   [place] [facility] Alfredia Ferguson, PA-C   1 year ago Type 2 diabetes mellitus without complication, without long-term current use of insulin [facility])   [place] [facility] Bosie Clos, MD   1 year ago Type 2 diabetes mellitus without complication, without long-term current use of insulin [facility])   [place] [facility] Bosie Clos, MD       Future Appointments             In 1 month Vanna Scotland, MD [facility] [place]

## 2023-03-07 DIAGNOSIS — H2511 Age-related nuclear cataract, right eye: Secondary | ICD-10-CM | POA: Diagnosis not present

## 2023-03-07 DIAGNOSIS — Z961 Presence of intraocular lens: Secondary | ICD-10-CM | POA: Diagnosis not present

## 2023-03-07 DIAGNOSIS — H401131 Primary open-angle glaucoma, bilateral, mild stage: Secondary | ICD-10-CM | POA: Diagnosis not present

## 2023-03-07 DIAGNOSIS — E119 Type 2 diabetes mellitus without complications: Secondary | ICD-10-CM | POA: Diagnosis not present

## 2023-03-17 ENCOUNTER — Other Ambulatory Visit: Payer: PPO

## 2023-03-19 ENCOUNTER — Ambulatory Visit: Payer: PPO | Admitting: Urology

## 2023-04-11 ENCOUNTER — Other Ambulatory Visit: Payer: Self-pay | Admitting: *Deleted

## 2023-04-11 DIAGNOSIS — C61 Malignant neoplasm of prostate: Secondary | ICD-10-CM

## 2023-04-13 ENCOUNTER — Other Ambulatory Visit: Payer: Self-pay | Admitting: Physician Assistant

## 2023-04-14 ENCOUNTER — Other Ambulatory Visit: Payer: PPO

## 2023-04-14 DIAGNOSIS — C61 Malignant neoplasm of prostate: Secondary | ICD-10-CM | POA: Diagnosis not present

## 2023-04-15 ENCOUNTER — Ambulatory Visit: Payer: PPO | Admitting: Urology

## 2023-04-15 VITALS — BP 151/68 | HR 75 | Ht 70.0 in | Wt 196.4 lb

## 2023-04-15 DIAGNOSIS — C61 Malignant neoplasm of prostate: Secondary | ICD-10-CM | POA: Diagnosis not present

## 2023-04-15 LAB — PSA: Prostate Specific Ag, Serum: 2.7 ng/mL (ref 0.0–4.0)

## 2023-04-15 NOTE — Progress Notes (Signed)
I,Amy L Pierron,acting as a scribe for Vanna Scotland, MD.,have documented all relevant documentation on the behalf of Vanna Scotland, MD,as directed by  Vanna Scotland, MD while in the presence of Vanna Scotland, MD.  04/15/2023 3:52 PM   Juan Mack December 21, 1945 [id number]  Referring provider: Bosie Clos, MD [address] [place],  [place] [address]  Chief Complaint  Patient presents with   Prostate Cancer    HPI: 77 year-old male with a personal history of prostate cancer presents today for routine annual follow-up.   He was diagnosed in 11/2014 with low risk prostate cancer. Gleason score 3+3 in the right mid lateral 1/2 biopsies 3% with 4.0 PSA.    Endorectal MRI in 01/2016 was unremarkable.   Currently on active surveillance. PSA remained stable around 2-2.5 ng/dL.   Notably, he does have known firm induration on the left.   His most recent PSA from 04/14/2023 is 2.7 which is stable.   He is doing well overall with no new symptoms or complaints.  PMH: Past Medical History:  Diagnosis Date   Anxiety    Anxiety disorder    BCC (basal cell carcinoma)    Central vein occlusion of retina    Dermatitis    Diabetes mellitus, type II (HCC)    Elevated PSA    Hyperlipemia    Hyperlipidemia    Hypertension    Insomnia    Insomnia    OSA on CPAP    not using CPAP   Prostate nodule    Pruritus    Stable central retinal vein occlusion     Surgical History: Past Surgical History:  Procedure Laterality Date   CATARACT EXTRACTION W/PHACO Left 12/27/2019   Procedure: CATARACT EXTRACTION PHACO AND INTRAOCULAR LENS PLACEMENT (IOC) LEFT DIABETIC 2.58  00:23.8;  Surgeon: Nevada Crane, MD;  Location: [facility];  Service: Ophthalmology;  Laterality: Left;   COLONOSCOPY WITH PROPOFOL N/A 03/21/2016   Procedure: COLONOSCOPY WITH PROPOFOL;  Surgeon: Scot Jun, MD;  Location: [facility] ENDOSCOPY;  Service: Endoscopy;  Laterality: N/A;   COLONOSCOPY WITH  PROPOFOL N/A 11/13/2016   Procedure: COLONOSCOPY WITH PROPOFOL;  Surgeon: Scot Jun, MD;  Location: [facility] ENDOSCOPY;  Service: Endoscopy;  Laterality: N/A;   NO PAST SURGERIES     TOOTH EXTRACTION      Home Medications:  Allergies as of 04/15/2023   No Known Allergies      Medication List        Accurate as of April 15, 2023  3:52 PM. If you have any questions, ask your nurse or doctor.          amLODipine 10 MG tablet Commonly known as: NORVASC TAKE 1 TABLET(10 MG) BY MOUTH DAILY   aspirin EC 81 MG tablet Take 81 mg by mouth daily. Swallow whole.   atorvastatin 20 MG tablet Commonly known as: LIPITOR TAKE 1 TABLET(20 MG) BY MOUTH DAILY   benazepril 40 MG tablet Commonly known as: LOTENSIN TAKE 1 TABLET BY MOUTH EVERY DAY   glucose blood test strip Check Blood Sugars Once Daily   hydrochlorothiazide 25 MG tablet Commonly known as: HYDRODIURIL TAKE 1 TABLET(25 MG) BY MOUTH DAILY   Lancets Misc Check Blood Sugars Once Daily   latanoprost 0.005 % ophthalmic solution Commonly known as: XALATAN Place 1 drop into both eyes at bedtime.   metFORMIN 1000 MG tablet Commonly known as: GLUCOPHAGE TAKE 1 TABLET(1000 MG) BY MOUTH TWICE DAILY   pioglitazone 45 MG tablet  Commonly known as: ACTOS TAKE 1 TABLET(45 MG) BY MOUTH DAILY   sertraline 50 MG tablet Commonly known as: ZOLOFT TAKE 1 TABLET(50 MG) BY MOUTH DAILY        Family History: Family History  Problem Relation Age of Onset   Prostate cancer Brother        Father   Diabetes Brother    Colon cancer Brother    Pancreatic cancer Sister    Lung cancer Sister    Coronary artery disease Father    Heart disease Father    Congestive Heart Failure Mother    Diabetes Mother     Social History:  reports that he quit smoking about 49 years ago. His smoking use included cigarettes. He started smoking about 63 years ago. He has never used smokeless tobacco. He reports that he does not drink alcohol  and does not use drugs.   Physical Exam: BP (!) 151/68   Pulse 75   Ht 5\' 10"  (1.778 m)   Wt 196 lb 6 oz (89.1 kg)   BMI 28.18 kg/m   Constitutional:  Alert and oriented, No acute distress. HEENT: [place] AT, moist mucus membranes.  Trachea midline, no masses. GU: Prostate asymmetry, left bigger than right with some induration., stable from previous exams Neurologic: Grossly intact, no focal deficits, moving all 4 extremities. Psychiatric: Normal mood and affect.   Assessment & Plan:    1. Prostate cancer (HCC)  - Active surveillance. His PSA is remarkable stable. DRE consistent with last year. Continue returning on an annual basis given his overall stability.   Return in about 1 year (around 04/14/2024) for DRE, PSA.  I have reviewed the above documentation for accuracy and completeness, and I agree with the above.   Vanna Scotland, MD   [facility] [address], [address] [place], [place] [address] [phone]

## 2023-04-16 DIAGNOSIS — E782 Mixed hyperlipidemia: Secondary | ICD-10-CM | POA: Diagnosis not present

## 2023-04-16 DIAGNOSIS — Z0000 Encounter for general adult medical examination without abnormal findings: Secondary | ICD-10-CM | POA: Diagnosis not present

## 2023-04-16 DIAGNOSIS — N1832 Chronic kidney disease, stage 3b: Secondary | ICD-10-CM | POA: Diagnosis not present

## 2023-04-16 DIAGNOSIS — Z8546 Personal history of malignant neoplasm of prostate: Secondary | ICD-10-CM | POA: Diagnosis not present

## 2023-04-16 DIAGNOSIS — F411 Generalized anxiety disorder: Secondary | ICD-10-CM | POA: Diagnosis not present

## 2023-04-16 DIAGNOSIS — E119 Type 2 diabetes mellitus without complications: Secondary | ICD-10-CM | POA: Diagnosis not present

## 2023-04-16 DIAGNOSIS — I10 Essential (primary) hypertension: Secondary | ICD-10-CM | POA: Diagnosis not present

## 2023-04-16 DIAGNOSIS — D508 Other iron deficiency anemias: Secondary | ICD-10-CM | POA: Diagnosis not present

## 2023-04-16 DIAGNOSIS — G4733 Obstructive sleep apnea (adult) (pediatric): Secondary | ICD-10-CM | POA: Diagnosis not present

## 2024-03-22 ENCOUNTER — Other Ambulatory Visit: Payer: Self-pay

## 2024-03-22 DIAGNOSIS — C61 Malignant neoplasm of prostate: Secondary | ICD-10-CM

## 2024-04-09 ENCOUNTER — Other Ambulatory Visit: Payer: Self-pay

## 2024-04-09 ENCOUNTER — Other Ambulatory Visit

## 2024-04-13 ENCOUNTER — Ambulatory Visit: Payer: Self-pay | Admitting: Urology

## 2024-04-13 ENCOUNTER — Ambulatory Visit: Admitting: Urology
